# Patient Record
Sex: Female | Born: 1985 | Race: White | Hispanic: No | Marital: Married | Smoking: Former smoker
Health system: Southern US, Community
[De-identification: ages and names within clinical notes are randomized; demographics above are authoritative.]

## PROBLEM LIST (undated history)

## (undated) DIAGNOSIS — Z789 Other specified health status: Secondary | ICD-10-CM

## (undated) DIAGNOSIS — D649 Anemia, unspecified: Secondary | ICD-10-CM

## (undated) DIAGNOSIS — R519 Headache, unspecified: Secondary | ICD-10-CM

## (undated) DIAGNOSIS — R32 Unspecified urinary incontinence: Secondary | ICD-10-CM

## (undated) HISTORY — PX: WISDOM TOOTH EXTRACTION: SHX21

---
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
## 2005-02-26 ENCOUNTER — Other Ambulatory Visit: Admission: RE | Admit: 2005-02-26 | Discharge: 2005-02-26 | Payer: Self-pay | Admitting: Family Medicine

## 2007-04-08 ENCOUNTER — Other Ambulatory Visit: Admission: RE | Admit: 2007-04-08 | Discharge: 2007-04-08 | Payer: Self-pay | Admitting: Family Medicine

## 2008-04-17 ENCOUNTER — Other Ambulatory Visit: Admission: RE | Admit: 2008-04-17 | Discharge: 2008-04-17 | Payer: Self-pay | Admitting: Family Medicine

## 2009-05-22 ENCOUNTER — Other Ambulatory Visit: Admission: RE | Admit: 2009-05-22 | Discharge: 2009-05-22 | Payer: Self-pay | Admitting: Family Medicine

## 2010-01-23 ENCOUNTER — Encounter: Payer: Self-pay | Admitting: Orthopedic Surgery

## 2010-01-30 ENCOUNTER — Ambulatory Visit: Payer: Self-pay | Admitting: Orthopedic Surgery

## 2010-01-30 DIAGNOSIS — M542 Cervicalgia: Secondary | ICD-10-CM | POA: Insufficient documentation

## 2010-02-04 ENCOUNTER — Telehealth: Payer: Self-pay | Admitting: Orthopedic Surgery

## 2010-04-25 ENCOUNTER — Encounter: Admission: RE | Admit: 2010-04-25 | Discharge: 2010-06-13 | Payer: Self-pay | Admitting: Orthopedic Surgery

## 2010-04-29 ENCOUNTER — Encounter: Payer: Self-pay | Admitting: Orthopedic Surgery

## 2010-05-02 ENCOUNTER — Telehealth: Payer: Self-pay | Admitting: Orthopedic Surgery

## 2010-10-15 NOTE — Letter (Signed)
Summary: History form  History form   Imported By: Jacklynn Ganong 02/14/2010 13:33:40  _____________________________________________________________________  External Attachment:    Type:   Image     Comment:   External Document

## 2010-10-15 NOTE — Progress Notes (Signed)
Summary: meds not helping  Phone Note Call from Patient   Caller: Patient Summary of Call: She is taken Naproxen, robaxin, and prednisone and she has no relief. She is taking 6 Naproxen a day and a 8 Robxin a day with no relief. She wants to know if she needs to be seen again or you could call her some different medicine. [phone].  Follow-up for Phone Call        do not take 6 a day take 2 a day   if you take 6 you will get an ulcer  take the meds as presrcibed   add ultracet 1 q 4 prn pain # 42 refill 5 Follow-up by: Fuller Canada MD,  Feb 04, 2010 8:09 AM    New/Updated Medications: ULTRACET 37.5-325 MG TABS (TRAMADOL-ACETAMINOPHEN) one by mouth q 4 hrs as needed pain Prescriptions: ULTRACET 37.5-325 MG TABS (TRAMADOL-ACETAMINOPHEN) one by mouth q 4 hrs as needed pain  #42 x 1   Entered by:   Waldon Reining   Authorized by:   Fuller Canada MD   Signed by:   Waldon Reining on 02/04/2010   Method used:   Faxed to ...       [facility] [phone]* (retail)       [address]       [place], [place]  [address]       Ph: [phone] or [phone]       Fax: [phone]   RxID:   [id number]   Appended Document: meds not helping advised pt

## 2010-10-15 NOTE — Progress Notes (Signed)
Summary: wants stronger pain med  Phone Note Call from Patient   Summary of Call: Brandi Day ([birth year]) says that the Ultracet is not touching her pain now that she is in therapy.  Her neck hurts really badly. Can you prescribe something stronger?  [facility] [place] Her # [phone] Initial call taken by: Jacklynn Ganong,  May 02, 2010 10:10 AM  Follow-up for Phone Call        can not change pain medication and   PS I dont need to see her any sooner  Follow-up by: Fuller Canada MD,  May 02, 2010 10:12 AM  Additional Follow-up for Phone Call Additional follow up Details #1::        Advised patient of doctor's reply Additional Follow-up by: Jacklynn Ganong,  May 02, 2010 10:46 AM

## 2010-10-15 NOTE — Miscellaneous (Signed)
Summary: PT initial evaluation  PT initial evaluation   Imported By: Jacklynn Ganong 04/29/2010 11:06:55  _____________________________________________________________________  External Attachment:    Type:   Image     Comment:   External Document

## 2010-10-15 NOTE — Assessment & Plan Note (Signed)
Summary: neck pain needs xr/bcbs/bsf   Vital Signs:  Patient profile:   25 year old female Height:      64 inches Weight:      127 pounds Pulse rate:   84 / minute Resp:     16 per minute  Vitals Entered By: Fuller Canada MD (Jan 30, 2010 9:00 AM)  Visit Type:  new patient Referring Provider:  self Primary Provider:  [facility]  CC:  neck pain.  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  The pain is dull it becomes a 9/10 at worse in the morning and at night.  It came on gradually but is now constant.  She gets some relief with 16 ibuprofen a day.  It seemed to get better if she pops her neck.  She complains of locking and catching.  She does not get relief of Flexeril.  She does indicate that she's been under a lot of stress because she is in college, she is newly married, and she has a new home.  Meds: Xanax, Claratin, Omeprazole, Seasonale.    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 Psychiatric:  Complains of anxiety; denies nervousness, depression, and hallucinations. Immunology:  Complains of seasonal allergies; denies sinus problems and allergic to bee stings.  The review of systems is negative for Constitutional, Cardiovascular, Respiratory, Genitourinary, Neurologic,  Musculoskeletal, Endocrine, Skin, HEENT, and Hemoatologic.  Physical Exam  Additional Exam:  Constitutional: vital signs see recorded values. General: normal development, nutrition, and grooming. No deformity. Body Habitus is medium. CDV: Observation and palpation was normal  Lymph: palpation of the lymph nodes were normal Skin: inspection and palpation of the skin revealed no abnormalities  Neuro: coordination: normal              DTR's normal              Sensation was normal  Psyche: Alert and oriented x 3. Mood was normal.  Affect: normal  MSK: Gait: normal    Cervical spine exam shows that she has tenderness in the midline of the cervical spine upper thoracic spine and medial border of the scapula.  She has tenderness in both paraspinal muscles and both trapezius muscles.  This decreased and painful range of motion in all planes.  Spurling sign negative.  Upper extremities C5-T1 strength was normal.     Impression & Recommendations:  Problem # 1:  NECK PAIN (ICD-723.1) Assessment New  AP lateral cervical spine there is complete straightening and loss of the normal cervical lordosis.  There are no evidence of a space narrowing or spur formation.  Impression loss of contour cervical spine  Axial neck pain is the most likely diagnosis.  Stress of course is contributing.  Recommend physical therapy, patient education with an Academy handout, steroid Dosepak, Robaxin, and steroid Dosepak finished the Naprosyn.  Her updated medication list for  this problem includes:    Robaxin 500 Mg Tabs (Methocarbamol) .Marland Kitchen... 1 by mouth q 6    Naprosyn 500 Mg Tabs (Naproxen) .Marland Kitchen... 1 by mouth two times a day  Orders: Physical Therapy Referral (PT) New Patient Level III (83151)  Medications Added to Medication List This Visit: 1)  Prednisone (pak) 5 Mg Tabs (Prednisone) .... As directed 2)  Robaxin 500 Mg Tabs (Methocarbamol) .Marland Kitchen.. 1 by mouth q 6 3)  Naprosyn 500 Mg Tabs (Naproxen) .Marland Kitchen.. 1 by  mouth two times a day  Patient Instructions: 1)  Axial Neck Pain  2)  Limit activity to comfort and avoid activities that increase discomfort.  Apply moist heat and/or ice to neck and take medication as instructed for pain relief. Please read the Neck Pain Handout and start Physical Therapy as directed. 3)  Change to robaxin  4)  and dose pack x 12 days , then naprosyn 500 mg two times a day  5)  Please schedule a follow-up appointment as needed. Prescriptions: NAPROSYN 500 MG TABS (NAPROXEN) 1 by mouth two times a day  #60 x 5   Entered and Authorized by:   Fuller Canada MD   Signed by:   Fuller Canada MD on 01/30/2010   Method used:   Print then Give to Patient   RxID:   [id number] ROBAXIN 500 MG TABS (METHOCARBAMOL) 1 by mouth q 6  #60 x 2   Entered and Authorized by:   Fuller Canada MD   Signed by:   Fuller Canada MD on 01/30/2010   Method used:   Print then Give to Patient   RxID:   [id number] PREDNISONE (PAK) 5 MG TABS (PREDNISONE) as directed  #1 x 1   Entered and Authorized by:   Fuller Canada MD   Signed by:   Fuller Canada MD on 01/30/2010   Method used:   Print then Give to Patient   RxID:   [id number]

## 2014-03-28 ENCOUNTER — Ambulatory Visit (INDEPENDENT_AMBULATORY_CARE_PROVIDER_SITE_OTHER): Payer: BC Managed Care – PPO | Admitting: Obstetrics

## 2014-03-28 ENCOUNTER — Encounter: Payer: Self-pay | Admitting: Obstetrics

## 2014-03-28 VITALS — BP 131/86 | HR 70 | Temp 100.4°F | Ht 63.0 in | Wt 137.0 lb

## 2014-03-28 DIAGNOSIS — Z3202 Encounter for pregnancy test, result negative: Secondary | ICD-10-CM

## 2014-03-28 DIAGNOSIS — Z01419 Encounter for gynecological examination (general) (routine) without abnormal findings: Secondary | ICD-10-CM

## 2014-03-28 DIAGNOSIS — Z3009 Encounter for other general counseling and advice on contraception: Secondary | ICD-10-CM

## 2014-03-28 LAB — POCT URINE PREGNANCY: Preg Test, Ur: NEGATIVE

## 2014-03-28 MED ORDER — DESOGESTREL-ETHINYL ESTRADIOL 0.15-30 MG-MCG PO TABS: 1.0000 | ORAL_TABLET | Freq: Every day | ORAL | Status: DC

## 2014-03-28 NOTE — Progress Notes (Signed)
Subjective:     Brandi SimmersSamantha S Day is a 28 y.o. female here for a routine exam.  Current complaints:  None.    Personal health questionnaire:  Is patient Ashkenazi Jewish, have a family history of breast and/or ovarian cancer: no Is there a family history of uterine cancer diagnosed at age < 3950, gastrointestinal cancer, urinary tract cancer, family member who is a Personnel officerLynch syndrome-associated carrier: no Is the patient overweight and hypertensive, family history of diabetes, personal history of gestational diabetes or PCOS: no Is patient over 555, have PCOS,  family history of premature CHD under age 30, diabetes, smoke, have hypertension or peripheral artery disease:  no  The HPI was reviewed and explored in further detail by the provider. Gynecologic History Patient's last menstrual period was 02/26/2014. Contraception: OCP (estrogen/progesterone) Last Pap: 2014. Results were: normal Last mammogram: n/a. Results were: n/a  Obstetric History OB History  Gravida Para Term Preterm AB SAB TAB Ectopic Multiple Living  1 1 1       1     # Outcome Date GA Lbr Len/2nd Weight Sex Delivery Anes PTL Lv  1 TRM 04/05/12 [id number]  7 lb 8 oz (3.402 kg) F LTCS Spinal  Y      History reviewed. No pertinent past medical history.  Past Surgical History  Procedure Laterality Date  . Cesarean section    . Wisdom tooth extraction      Current outpatient prescriptions:desogestrel-ethinyl estradiol (APRI,EMOQUETTE,SOLIA) 0.15-30 MG-MCG tablet, Take 1 tablet by mouth daily., Disp: 1 Package, Rfl: 11 No Known Allergies  History  Substance Use Topics  . Smoking status: Former Smoker    Start date: 03/29/2011  . Smokeless tobacco: Never Used  . Alcohol Use: 1.2 oz/week    1 Glasses of wine, 1 Cans of beer per week    Family History  Problem Relation Age of Onset  . Cancer Mother   . Hypertension Father   . Diabetes Father       Review of Systems  Constitutional: negative for fatigue and weight  loss Respiratory: negative for cough and wheezing Cardiovascular: negative for chest pain, fatigue and palpitations Gastrointestinal: negative for abdominal pain and change in bowel habits Musculoskeletal:negative for myalgias Neurological: negative for gait problems and tremors Behavioral/Psych: negative for abusive relationship, depression Endocrine: negative for temperature intolerance   Genitourinary:negative for abnormal menstrual periods, genital lesions, hot flashes, sexual problems and vaginal discharge Integument/breast: negative for breast lump, breast tenderness, nipple discharge and skin lesion(s)    Objective:       BP 131/86  Pulse 70  Temp(Src) 100.4 F (38 C)  Ht 5\' 3"  (1.6 m)  Wt 137 lb (62.143 kg)  BMI 24.27 kg/m2  LMP 02/26/2014 General:   alert PE:        Breasts:  Negative      Abdomen:  Soft, nontender.      Pelvic:  NEFG.  Vulva, vagina and cervix normal.                   Uterus NSSC, nontender.                    Adnexa negative     Lab Review Urine pregnancy test negative Labs reviewed yes Radiologic studies reviewed yes    Assessment:    Healthy female exam.    Plan:    Education reviewed: self breast exams. Contraception: OCP (estrogen/progesterone). Follow up in: 1 year.   Meds ordered this encounter  Medications  . DISCONTD: desogestrel-ethinyl estradiol (APRI,EMOQUETTE,SOLIA) 0.15-30 MG-MCG tablet    Sig: Take 1 tablet by mouth daily.  Marland Kitchen desogestrel-ethinyl estradiol (APRI,EMOQUETTE,SOLIA) 0.15-30 MG-MCG tablet    Sig: Take 1 tablet by mouth daily.    Dispense:  1 Package    Refill:  11   No orders of the defined types were placed in this encounter.

## 2014-03-28 NOTE — Addendum Note (Signed)
Addended by: Odessa FlemingBOHNE, Otto Caraway M on: 03/28/2014 04:46 PM   Modules accepted: Orders

## 2014-03-29 LAB — WET PREP BY MOLECULAR PROBE
Candida species: NEGATIVE
Gardnerella vaginalis: NEGATIVE
Trichomonas vaginosis: NEGATIVE

## 2014-03-29 LAB — GC/CHLAMYDIA PROBE AMP
CT PROBE, AMP APTIMA: NEGATIVE
GC PROBE AMP APTIMA: NEGATIVE

## 2014-03-29 NOTE — Addendum Note (Signed)
Addended by: Odessa FlemingBOHNE, Jayvon Mounger M on: 03/29/2014 06:34 PM   Modules accepted: Orders

## 2014-03-30 LAB — PAP IG AND HPV HIGH-RISK: HPV DNA High Risk: NOT DETECTED

## 2014-07-17 ENCOUNTER — Encounter: Payer: Self-pay | Admitting: Obstetrics

## 2014-09-12 ENCOUNTER — Telehealth: Payer: Self-pay | Admitting: *Deleted

## 2014-09-12 NOTE — Telephone Encounter (Signed)
Error

## 2014-10-31 ENCOUNTER — Telehealth: Payer: Self-pay | Admitting: Obstetrics

## 2014-11-06 ENCOUNTER — Ambulatory Visit (INDEPENDENT_AMBULATORY_CARE_PROVIDER_SITE_OTHER): Payer: BLUE CROSS/BLUE SHIELD | Admitting: Obstetrics

## 2014-11-06 VITALS — BP 132/91 | HR 79 | Wt 143.0 lb

## 2014-11-06 DIAGNOSIS — N839 Noninflammatory disorder of ovary, fallopian tube and broad ligament, unspecified: Secondary | ICD-10-CM

## 2014-11-06 NOTE — Telephone Encounter (Signed)
11/06/2014 - Patient has not returned calls to schedule appt. brm

## 2014-11-07 ENCOUNTER — Encounter: Payer: Self-pay | Admitting: Obstetrics

## 2014-11-07 NOTE — Progress Notes (Signed)
Patient ID: Brandi Day, female   DOB: 08/25/1986, 29 y.o.   MRN: [id number]  Chief Complaint  Patient presents with  . Advice Only    HPI Brandi SimmersSamantha S Cassar is a 29 y.o. female.  Has been trying to get pregnant for the past 7 months without success.  Purchased ovulation indicators which are positive for ovulation.  Questions whether her fiance' is fertile.  HPI  History reviewed. No pertinent past medical history.  Past Surgical History  Procedure Laterality Date  . Cesarean section    . Wisdom tooth extraction      Family History  Problem Relation Age of Onset  . Cancer Mother   . Hypertension Father   . Diabetes Father     Social History History  Substance Use Topics  . Smoking status: Former Smoker    Start date: 03/29/2011  . Smokeless tobacco: Never Used  . Alcohol Use: 1.2 oz/week    1 Glasses of wine, 1 Cans of beer per week    No Known Allergies  Current Outpatient Prescriptions  Medication Sig Dispense Refill  . prenatal vitamin w/FE, FA (PRENATAL 1 + 1) 27-1 MG TABS tablet Take 1 tablet by mouth daily at 12 noon.    . desogestrel-ethinyl estradiol (APRI,EMOQUETTE,SOLIA) 0.15-30 MG-MCG tablet Take 1 tablet by mouth daily. (Patient not taking: Reported on 11/06/2014) 1 Package 11   No current facility-administered medications for this visit.    Review of Systems Review of Systems Constitutional: negative for fatigue and weight loss Respiratory: negative for cough and wheezing Cardiovascular: negative for chest pain, fatigue and palpitations Gastrointestinal: negative for abdominal pain and change in bowel habits Genitourinary:negative Integument/breast: negative for nipple discharge Musculoskeletal:negative for myalgias Neurological: negative for gait problems and tremors Behavioral/Psych: negative for abusive relationship, depression Endocrine: negative for temperature intolerance     Blood pressure 132/91, pulse 79, weight 143 lb (64.864 kg), last  menstrual period 10/16/2014.  Physical Exam Physical Exam General:   alert  Skin:   no rash or abnormalities  Lungs:   clear to auscultation bilaterally  Heart:   regular rate and rhythm, S1, S2 normal, no murmur, click, rub or gallop  Breasts:   normal without suspicious masses, skin or nipple changes or axillary nodes  Abdomen:  normal findings: no organomegaly, soft, non-tender and no hernia  Pelvis:  External genitalia: normal general appearance Urinary system: urethral meatus normal and bladder without fullness, nontender Vaginal: normal without tenderness, induration or masses Cervix: normal appearance Adnexa: normal bimanual exam Uterus: anteverted and non-tender, normal size    100% of 10 min visit spent on counseling and coordination of care.   Data Reviewed Menstrual History  Assessment     Possible ovulation disorder      Plan    Start timed intercourse on days 10 - 20 only, every other day. F/U in 4 months. May need referral to Rep. Endo.  No orders of the defined types were placed in this encounter.   Meds ordered this encounter  Medications  . prenatal vitamin w/FE, FA (PRENATAL 1 + 1) 27-1 MG TABS tablet    Sig: Take 1 tablet by mouth daily at 12 noon.

## 2014-12-18 ENCOUNTER — Encounter (HOSPITAL_COMMUNITY): Payer: Self-pay | Admitting: *Deleted

## 2014-12-18 ENCOUNTER — Inpatient Hospital Stay (HOSPITAL_COMMUNITY)
Admission: AD | Admit: 2014-12-18 | Discharge: 2014-12-18 | Disposition: A | Discharge status: Home / Self Care | Payer: BLUE CROSS/BLUE SHIELD | Source: Ambulatory Visit | Attending: Obstetrics | Admitting: Obstetrics

## 2014-12-18 DIAGNOSIS — Z3202 Encounter for pregnancy test, result negative: Secondary | ICD-10-CM | POA: Insufficient documentation

## 2014-12-18 DIAGNOSIS — Z87891 Personal history of nicotine dependence: Secondary | ICD-10-CM | POA: Insufficient documentation

## 2014-12-18 DIAGNOSIS — N939 Abnormal uterine and vaginal bleeding, unspecified: Secondary | ICD-10-CM | POA: Insufficient documentation

## 2014-12-18 LAB — HCG, QUANTITATIVE, PREGNANCY: hCG, Beta Chain, Quant, S: 2 m[IU]/mL (ref <5)

## 2014-12-18 LAB — URINALYSIS, ROUTINE W REFLEX MICROSCOPIC
Bilirubin Urine: NEGATIVE
Glucose, UA: NEGATIVE mg/dL
Ketones, ur: NEGATIVE mg/dL
Leukocytes, UA: NEGATIVE
Nitrite: NEGATIVE
Protein, ur: NEGATIVE mg/dL
Specific Gravity, Urine: 1.015 (ref 1.005–1.030)
Urobilinogen, UA: 0.2 mg/dL (ref 0.0–1.0)
pH: 7.5 (ref 5.0–8.0)

## 2014-12-18 LAB — URINE MICROSCOPIC-ADD ON

## 2014-12-18 NOTE — MAU Provider Note (Signed)
History     CSN: [id number]  Arrival date and time: 12/18/14 1026   First Provider Initiated Contact with Patient 12/18/14 1201      Chief Complaint  Patient presents with  . Miscarriage   HPI  Ms. Brandi Day is a 29 y.o. G1P1001 at Unknown who presents to MAU today with complaint of vaginal bleeding. The patient states that she was trying to conceive and had a +HPT on Thursday. She then went to Urgent Care to confirm the pregnancy. The UPT there was negative and quant hCG was 10 per patient report. The patient began having bleeding a little heavier than a period on Saturday night. She was having cramping, but denies pain now. She also denies fever or N/V/D. Last intercourse was Saturday night prior to the onset of bleeding. LMP 11/13/14.   OB History    Gravida Para Term Preterm AB TAB SAB Ectopic Multiple Living   1 1 1       1       History reviewed. No pertinent past medical history.  Past Surgical History  Procedure Laterality Date  . Cesarean section    . Wisdom tooth extraction      Family History  Problem Relation Age of Onset  . Cancer Mother   . Hypertension Father   . Diabetes Father     History  Substance Use Topics  . Smoking status: Former Smoker    Start date: 03/29/2011  . Smokeless tobacco: Never Used  . Alcohol Use: 1.2 oz/week    1 Glasses of wine, 1 Cans of beer per week     Comment: LAST  DRANK-  WINE  - YESTERDAY    Allergies: No Known Allergies  No prescriptions prior to admission    Review of Systems  Constitutional: Negative for fever and malaise/fatigue.  Gastrointestinal: Negative for nausea, vomiting, abdominal pain, diarrhea and constipation.  Genitourinary:       + vaginal bleeding   Physical Exam   Blood pressure 110/69, pulse 70, temperature 98.2 F (36.8 C), temperature source Oral, resp. rate 20, height 5\' 2"  (1.575 m), weight 145 lb (65.772 kg), last menstrual period 11/13/2014.  Physical Exam  Constitutional: She  is oriented to person, place, and time. She appears well-developed and well-nourished. No distress.  HENT:  Head: Normocephalic.  Cardiovascular: Normal rate.   Respiratory: Effort normal.  GI: Soft. She exhibits no distension and no mass. There is tenderness (very mild tenderness to palpation of the suprapubic region at midline). There is no rebound and no guarding.  Genitourinary: Uterus is not enlarged and not tender. Cervix exhibits no motion tenderness, no discharge and no friability. Right adnexum displays no mass and no tenderness. Left adnexum displays no mass and no tenderness. There is bleeding (small amount of blood noted in the vaginal vault, mostly dark red/brown) in the vagina. No vaginal discharge found.  Cervix: closed, thick, posterior  Neurological: She is alert and oriented to person, place, and time.  Skin: Skin is warm and dry. No erythema.  Psychiatric: She has a normal mood and affect.   Results for orders placed or performed during the hospital encounter of 12/18/14 (from the past 24 hour(s))  Urinalysis, Routine w reflex microscopic     Status: Abnormal   Collection Time: 12/18/14 10:43 AM  Result Value Ref Range   Color, Urine YELLOW YELLOW   APPearance CLEAR CLEAR   Specific Gravity, Urine 1.015 1.005 - 1.030   pH 7.5 5.0 - 8.0  Glucose, UA NEGATIVE NEGATIVE mg/dL   Hgb urine dipstick TRACE (A) NEGATIVE   Bilirubin Urine NEGATIVE NEGATIVE   Ketones, ur NEGATIVE NEGATIVE mg/dL   Protein, ur NEGATIVE NEGATIVE mg/dL   Urobilinogen, UA 0.2 0.0 - 1.0 mg/dL   Nitrite NEGATIVE NEGATIVE   Leukocytes, UA NEGATIVE NEGATIVE  Urine microscopic-add on     Status: Abnormal   Collection Time: 12/18/14 10:43 AM  Result Value Ref Range   Squamous Epithelial / LPF FEW (A) RARE   WBC, UA 0-2 <3 WBC/hpf   Bacteria, UA RARE RARE  hCG, quantitative, pregnancy     Status: None   Collection Time: 12/18/14 11:43 AM  Result Value Ref Range   hCG, Beta Chain, Quant, S 2 <5  mIU/mL    MAU Course  Procedures None  MDM UPT - negative, given patient report of recent +HPT, quant hCG ordered.  Quant hCG is <5 Without other labs in our system unsure if this is early SAB vs recent false positive HPT Discussed with patient that given her report of recent events it is more likely a very early SAB that is now complete  Assessment and Plan  A: Vaginal bleeding  P: Discharge home Bleeding precautions discussed Patient advised to follow-up with Dr. Clearance Coots as scheduled Patient may return to MAU as needed or if her condition were to change or worsen    Marny Lowenstein, PA-C  12/18/2014, 2:00 PM

## 2014-12-18 NOTE — Discharge Instructions (Signed)
Miscarriage °A miscarriage is the loss of an unborn baby (fetus) before the 20th week of pregnancy. The cause is often unknown.  °HOME CARE °· You may need to stay in bed (bed rest), or you may be able to do light activity. Go about activity as told by your doctor. °· Have help at home. °· Write down how many pads you use each day. Write down how soaked they are. °· Do not use tampons. Do not wash out your vagina (douche) or have sex (intercourse) until your doctor approves. °· Only take medicine as told by your doctor. °· Do not take aspirin. °· Keep all doctor visits as told. °· If you or your partner have problems with grieving, talk to your doctor. You can also try counseling. Give yourself time to grieve before trying to get pregnant again. °GET HELP RIGHT AWAY IF: °· You have bad cramps or pain in your back or belly (abdomen). °· You have a fever. °· You pass large clumps of blood (clots) from your vagina that are walnut-sized or larger. Save the clumps for your doctor to see. °· You pass large amounts of tissue from your vagina. Save the tissue for your doctor to see. °· You have more bleeding. °· You have thick, bad-smelling fluid (discharge) coming from the vagina. °· You get lightheaded, weak, or you pass out (faint). °· You have chills. °MAKE SURE YOU: °· Understand these instructions. °· Will watch your condition. °· Will get help right away if you are not doing well or get worse. °Document Released: 11/24/2011 Document Reviewed: 11/24/2011 °ExitCare® Patient Information ©2015 [employer]. This information is not intended to replace advice given to you by your health care provider. Make sure you discuss any questions you have with your health care provider. ° °

## 2014-12-18 NOTE — MAU Note (Addendum)
PT SAYS SHE WAS IN DR HARPER'S  OFFICE IN JAN    -   TOLD UNABLE  TO GET PREG.-   SO PLAN WAS TO TRY  TO GET PREG  AND IF  NOT BY MAY - RETURN TO OFFICE,     LAST  Thursday-   HAD POSITIVE HPT-  THEN WENT  TO URGENT  CARE  FOR LABS -     THEIR UPT  WAS NEG   BUT  SERUM TEST  WAS  POSITIVE-  QUANT     THEN ON SAT NIGHT  STARTED  CRAMPING - WENT  TO B-ROOM - SAW VAG BLEEDING  AND HAS CONTINUED  LIKE   CYCLE -   ON PAD NOW IN ROOM - NOTHING.     NO PAIN  NOW.

## 2015-01-31 ENCOUNTER — Telehealth: Payer: Self-pay | Admitting: *Deleted

## 2015-01-31 NOTE — Telephone Encounter (Signed)
Patient states she was in to see Dr Clearance CootsHarper and she discussed a referral to infertility- she tried to call for an appointment and was told she does need a referral. Call forwarded to provider for referral order.

## 2015-02-05 ENCOUNTER — Ambulatory Visit: Payer: BLUE CROSS/BLUE SHIELD | Admitting: Obstetrics

## 2015-02-06 ENCOUNTER — Other Ambulatory Visit: Payer: Self-pay | Admitting: Obstetrics

## 2015-02-06 DIAGNOSIS — N839 Noninflammatory disorder of ovary, fallopian tube and broad ligament, unspecified: Secondary | ICD-10-CM

## 2015-02-14 ENCOUNTER — Telehealth: Payer: Self-pay

## 2015-02-14 NOTE — Telephone Encounter (Signed)
FYI, Dr. Lyndal Rainbowamer Yalcinkaya's office is backed up [facility]) for scheduling referrals due to fact they are short-staffed. Zella BallRobin, who schedules referrals should be caught up next week or so and will schedule appts and call patients. I let patient(s) know.

## 2015-02-14 NOTE — Telephone Encounter (Signed)
Patient is scheduled with Dr. April MansonYalcinkaya on 04/13/15 at 10:30am [facility] [place] office was first available - patient is aware of appt and time

## 2015-02-15 ENCOUNTER — Other Ambulatory Visit (INDEPENDENT_AMBULATORY_CARE_PROVIDER_SITE_OTHER): Payer: BLUE CROSS/BLUE SHIELD

## 2015-02-15 VITALS — BP 123/82 | HR 75 | Temp 100.1°F | Ht 63.0 in | Wt 144.0 lb

## 2015-02-15 DIAGNOSIS — N926 Irregular menstruation, unspecified: Secondary | ICD-10-CM | POA: Diagnosis not present

## 2015-02-15 LAB — POCT URINE PREGNANCY: PREG TEST UR: POSITIVE

## 2015-02-15 NOTE — Progress Notes (Unsigned)
Patient in office today for a confirmation of pregnancy. Patient states she had 3 positive home pregnancy test. Patient is concerned because she had a chemical pregnancy about 2 months ago. Pregnancy Test in office is positive.   BP 123/82 mmHg  Pulse 75  Temp(Src) 100.1 F (37.8 C)  Ht 5\' 3"  (1.6 m)  Wt 144 lb (65.318 kg)  BMI 25.51 kg/m2  LMP 01/14/2015

## 2015-02-16 ENCOUNTER — Telehealth: Payer: Self-pay | Admitting: *Deleted

## 2015-02-16 LAB — HCG, QUANTITATIVE, PREGNANCY: HCG, BETA CHAIN, QUANT, S: 38.5 m[IU]/mL

## 2015-02-16 NOTE — Telephone Encounter (Signed)
Patient called requesting her results from her Hcg blood work. Patient advised of test results. Patient advised to come Monday morning for a repeat Hcg. Patient verbalized understanding.

## 2015-02-19 ENCOUNTER — Ambulatory Visit: Payer: BLUE CROSS/BLUE SHIELD

## 2015-02-19 DIAGNOSIS — N926 Irregular menstruation, unspecified: Secondary | ICD-10-CM

## 2015-02-20 LAB — HCG, QUANTITATIVE, PREGNANCY: hCG, Beta Chain, Quant, S: 266.4 m[IU]/mL

## 2015-02-21 ENCOUNTER — Other Ambulatory Visit: Payer: BLUE CROSS/BLUE SHIELD

## 2015-02-21 DIAGNOSIS — N926 Irregular menstruation, unspecified: Secondary | ICD-10-CM

## 2015-02-22 LAB — HCG, QUANTITATIVE, PREGNANCY: hCG, Beta Chain, Quant, S: 562 m[IU]/mL

## 2015-02-26 ENCOUNTER — Other Ambulatory Visit: Payer: Self-pay | Admitting: Certified Nurse Midwife

## 2015-02-26 ENCOUNTER — Telehealth: Payer: Self-pay | Admitting: *Deleted

## 2015-02-26 ENCOUNTER — Ambulatory Visit (HOSPITAL_COMMUNITY)
Admission: RE | Admit: 2015-02-26 | Discharge: 2015-02-26 | Disposition: A | Discharge status: Home / Self Care | Payer: BLUE CROSS/BLUE SHIELD | Source: Ambulatory Visit | Attending: Certified Nurse Midwife | Admitting: Certified Nurse Midwife

## 2015-02-26 ENCOUNTER — Other Ambulatory Visit: Payer: Self-pay | Admitting: *Deleted

## 2015-02-26 DIAGNOSIS — O3680X0 Pregnancy with inconclusive fetal viability, not applicable or unspecified: Secondary | ICD-10-CM | POA: Diagnosis present

## 2015-02-26 DIAGNOSIS — O3680X1 Pregnancy with inconclusive fetal viability, fetus 1: Secondary | ICD-10-CM

## 2015-02-26 NOTE — Telephone Encounter (Signed)
Patient just following up to see when her ultrasound would be scheduled for. Patient advised the order is in and Lesle Reek is working on scheduling her appointment. Patient advised she should receive a call for her appointment. Patient also advised once she has her ultrasound and we determine viability we will schedule her NOB appointment. Patient verbalized understanding.

## 2015-02-26 NOTE — Telephone Encounter (Signed)
Patient contacted the office upset after her ultrasound today. Patient is requesting ultrasound results. Reviewed ultrasound results with patient. Patient reassured that everything is progressing as it should. Patient advised she will have a follow up ultrasound in 2 weeks to make sure baby is seen. Patient verbalized understanding.

## 2015-02-28 ENCOUNTER — Other Ambulatory Visit: Payer: Self-pay | Admitting: Certified Nurse Midwife

## 2015-02-28 ENCOUNTER — Telehealth: Payer: Self-pay

## 2015-02-28 DIAGNOSIS — Z3490 Encounter for supervision of normal pregnancy, unspecified, unspecified trimester: Secondary | ICD-10-CM

## 2015-02-28 NOTE — Telephone Encounter (Signed)
PATIENT WANTED TO CANCEL U/S AT [facility] ON 6/27, AS SHE IS GOING TO [facility] AND HAS REQUESTED HER MEDICAL RECORDS BE SENT THERE

## 2015-03-08 ENCOUNTER — Other Ambulatory Visit: Payer: BLUE CROSS/BLUE SHIELD

## 2015-03-12 ENCOUNTER — Ambulatory Visit (HOSPITAL_COMMUNITY): Payer: BLUE CROSS/BLUE SHIELD

## 2015-03-21 LAB — OB RESULTS CONSOLE ABO/RH: RH Type: POSITIVE

## 2015-03-21 LAB — OB RESULTS CONSOLE ANTIBODY SCREEN: ANTIBODY SCREEN: NEGATIVE

## 2015-03-21 LAB — OB RESULTS CONSOLE RUBELLA ANTIBODY, IGM: RUBELLA: IMMUNE

## 2015-03-21 LAB — OB RESULTS CONSOLE HEPATITIS B SURFACE ANTIGEN: Hepatitis B Surface Ag: NEGATIVE

## 2015-03-21 LAB — OB RESULTS CONSOLE HIV ANTIBODY (ROUTINE TESTING): HIV: NONREACTIVE

## 2015-03-21 LAB — OB RESULTS CONSOLE RPR: RPR: NONREACTIVE

## 2015-03-28 LAB — OB RESULTS CONSOLE GC/CHLAMYDIA
CHLAMYDIA, DNA PROBE: NEGATIVE
GC PROBE AMP, GENITAL: NEGATIVE

## 2015-09-16 NOTE — L&D Delivery Note (Signed)
Delivery Note At 5:24 PM a viable and healthy female was delivered via  (Presentation: LOA  ).  APGAR: 6, 9; weight  pending.   Placenta status: spontaneious, intact.  Cord:  with the following complications: none.  Cord pH: pending  Anesthesia:  epidural Episiotomy:  none Lacerations:  none Suture Repair: 2.0 vicryl rapide Est. Blood Loss (mL):  200  Mom to postpartum.  Baby to Couplet care / Skin to Skin.  Yalda Herd J 10/15/2015, 5:35 PM

## 2015-09-25 LAB — OB RESULTS CONSOLE GBS: GBS: NEGATIVE

## 2015-10-14 ENCOUNTER — Inpatient Hospital Stay (HOSPITAL_COMMUNITY)
Admission: AD | Admit: 2015-10-14 | Discharge: 2015-10-17 | DRG: 775 | Disposition: A | Discharge status: Home / Self Care | Payer: BLUE CROSS/BLUE SHIELD | Source: Ambulatory Visit | Attending: Obstetrics and Gynecology | Admitting: Obstetrics and Gynecology

## 2015-10-14 ENCOUNTER — Inpatient Hospital Stay (HOSPITAL_COMMUNITY): Payer: BLUE CROSS/BLUE SHIELD | Admitting: Anesthesiology

## 2015-10-14 ENCOUNTER — Encounter (HOSPITAL_COMMUNITY): Payer: Self-pay | Admitting: *Deleted

## 2015-10-14 DIAGNOSIS — Z8249 Family history of ischemic heart disease and other diseases of the circulatory system: Secondary | ICD-10-CM | POA: Diagnosis not present

## 2015-10-14 DIAGNOSIS — O34219 Maternal care for unspecified type scar from previous cesarean delivery: Secondary | ICD-10-CM | POA: Diagnosis present

## 2015-10-14 DIAGNOSIS — D72829 Elevated white blood cell count, unspecified: Secondary | ICD-10-CM | POA: Diagnosis not present

## 2015-10-14 DIAGNOSIS — D62 Acute posthemorrhagic anemia: Secondary | ICD-10-CM | POA: Diagnosis not present

## 2015-10-14 DIAGNOSIS — O4292 Full-term premature rupture of membranes, unspecified as to length of time between rupture and onset of labor: Principal | ICD-10-CM | POA: Diagnosis present

## 2015-10-14 DIAGNOSIS — Z833 Family history of diabetes mellitus: Secondary | ICD-10-CM

## 2015-10-14 DIAGNOSIS — Z87891 Personal history of nicotine dependence: Secondary | ICD-10-CM

## 2015-10-14 DIAGNOSIS — O9081 Anemia of the puerperium: Secondary | ICD-10-CM | POA: Diagnosis not present

## 2015-10-14 DIAGNOSIS — D509 Iron deficiency anemia, unspecified: Secondary | ICD-10-CM | POA: Diagnosis present

## 2015-10-14 DIAGNOSIS — O4290 Premature rupture of membranes, unspecified as to length of time between rupture and onset of labor, unspecified weeks of gestation: Secondary | ICD-10-CM | POA: Diagnosis not present

## 2015-10-14 DIAGNOSIS — Z3A37 37 weeks gestation of pregnancy: Secondary | ICD-10-CM | POA: Diagnosis not present

## 2015-10-14 DIAGNOSIS — O99019 Anemia complicating pregnancy, unspecified trimester: Secondary | ICD-10-CM

## 2015-10-14 HISTORY — DX: Other specified health status: Z78.9

## 2015-10-14 LAB — TYPE AND SCREEN
ABO/RH(D): AB POS
ANTIBODY SCREEN: NEGATIVE

## 2015-10-14 LAB — CBC
HEMATOCRIT: 31.7 % — AB (ref 36.0–46.0)
HEMOGLOBIN: 10.6 g/dL — AB (ref 12.0–15.0)
MCH: 28 pg (ref 26.0–34.0)
MCHC: 33.4 g/dL (ref 30.0–36.0)
MCV: 83.6 fL (ref 78.0–100.0)
Platelets: 302 10*3/uL (ref 150–400)
RBC: 3.79 MIL/uL — ABNORMAL LOW (ref 3.87–5.11)
RDW: 13.5 % (ref 11.5–15.5)
WBC: 10.5 10*3/uL (ref 4.0–10.5)

## 2015-10-14 LAB — ABO/RH: ABO/RH(D): AB POS

## 2015-10-14 LAB — POCT FERN TEST: POCT Fern Test: POSITIVE

## 2015-10-14 MED ORDER — OXYCODONE-ACETAMINOPHEN 5-325 MG PO TABS: 2.0000 | ORAL_TABLET | ORAL | Status: DC | PRN

## 2015-10-14 MED ORDER — LACTATED RINGERS IV SOLN
INTRAVENOUS | Status: DC
  Administered 2015-10-14 – 2015-10-15 (×2): via INTRAVENOUS

## 2015-10-14 MED ORDER — ONDANSETRON HCL 4 MG/2ML IJ SOLN: 4.0000 mg | Freq: Four times a day (QID) | INTRAMUSCULAR | Status: DC | PRN

## 2015-10-14 MED ORDER — EPHEDRINE 5 MG/ML INJ
10.0000 mg | INTRAVENOUS | Status: DC | PRN
  Filled 2015-10-14: qty 2

## 2015-10-14 MED ORDER — EPHEDRINE 5 MG/ML INJ: 10.0000 mg | INTRAVENOUS | Status: DC | PRN

## 2015-10-14 MED ORDER — OXYCODONE-ACETAMINOPHEN 5-325 MG PO TABS
1.0000 | ORAL_TABLET | ORAL | Status: DC | PRN
Start: 2015-10-14 — End: 2015-10-15

## 2015-10-14 MED ORDER — LACTATED RINGERS IV SOLN
500.0000 mL | INTRAVENOUS | Status: DC | PRN
  Administered 2015-10-14: 500 mL via INTRAVENOUS
  Administered 2015-10-15: 300 mL via INTRAVENOUS
  Administered 2015-10-15: 500 mL via INTRAVENOUS

## 2015-10-14 MED ORDER — FLEET ENEMA 7-19 GM/118ML RE ENEM: 1.0000 | ENEMA | RECTAL | Status: DC | PRN

## 2015-10-14 MED ORDER — DIPHENHYDRAMINE HCL 50 MG/ML IJ SOLN: 12.5000 mg | INTRAMUSCULAR | Status: DC | PRN

## 2015-10-14 MED ORDER — ACETAMINOPHEN 325 MG PO TABS
650.0000 mg | ORAL_TABLET | ORAL | Status: DC | PRN
  Administered 2015-10-15 (×2): 650 mg via ORAL
  Filled 2015-10-14 (×2): qty 2

## 2015-10-14 MED ORDER — OXYTOCIN BOLUS FROM INFUSION: 500.0000 mL | INTRAVENOUS | Status: DC

## 2015-10-14 MED ORDER — LIDOCAINE HCL (PF) 1 % IJ SOLN
INTRAMUSCULAR | Status: DC | PRN
  Administered 2015-10-14: 4 mL via EPIDURAL
  Administered 2015-10-14: 3 mL via EPIDURAL

## 2015-10-14 MED ORDER — LIDOCAINE HCL (PF) 1 % IJ SOLN
30.0000 mL | INTRAMUSCULAR | Status: DC | PRN
  Filled 2015-10-14: qty 30

## 2015-10-14 MED ORDER — FENTANYL 2.5 MCG/ML BUPIVACAINE 1/10 % EPIDURAL INFUSION (WH - ANES): 14.0000 mL/h | INTRAMUSCULAR | Status: DC | PRN

## 2015-10-14 MED ORDER — PHENYLEPHRINE 40 MCG/ML (10ML) SYRINGE FOR IV PUSH (FOR BLOOD PRESSURE SUPPORT): 80.0000 ug | PREFILLED_SYRINGE | INTRAVENOUS | Status: DC | PRN

## 2015-10-14 MED ORDER — PHENYLEPHRINE 40 MCG/ML (10ML) SYRINGE FOR IV PUSH (FOR BLOOD PRESSURE SUPPORT)
80.0000 ug | PREFILLED_SYRINGE | INTRAVENOUS | Status: DC | PRN
  Administered 2015-10-14 – 2015-10-15 (×2): 80 ug via INTRAVENOUS
  Filled 2015-10-14: qty 2
  Filled 2015-10-14 (×2): qty 20

## 2015-10-14 MED ORDER — FENTANYL 2.5 MCG/ML BUPIVACAINE 1/10 % EPIDURAL INFUSION (WH - ANES)
14.0000 mL/h | INTRAMUSCULAR | Status: DC | PRN
  Administered 2015-10-15 (×2): 14 mL/h via EPIDURAL
  Filled 2015-10-14 (×3): qty 125

## 2015-10-14 MED ORDER — OXYTOCIN 10 UNIT/ML IJ SOLN: 2.5000 [IU]/h | INTRAVENOUS | Status: DC

## 2015-10-14 MED ORDER — CITRIC ACID-SODIUM CITRATE 334-500 MG/5ML PO SOLN: 30.0000 mL | ORAL | Status: DC | PRN

## 2015-10-14 NOTE — Progress Notes (Signed)
Brandi Day is a 30 y.o. G2P1001 at [redacted]w[redacted]d by LMP admitted for rupture of membranes for attempted TOLAC with unfavorable cervix  Subjective: Now comfortable after epidural  Objective: BP 117/67 mmHg  Pulse 108  Temp(Src) 98 F (36.7 C) (Oral)  Resp 16  Ht  (1.6 m)  Wt 77.111 kg (170 lb)  BMI 30.12 kg/m2  SpO2 99%  LMP 01/14/2015      FHT:  FHR: 145 bpm, variability: moderate,  accelerations:  Present,  decelerations:  Absent UC:   irregular, every 2-5 minutes SVE:   Dilation: 1 Effacement (%): 70, 80 Station: -3 Exam by:: AMariel Sleet, RN  Clear AF  Labs: Lab Results  Component Value Date   WBC 10.5 10/14/2015   HGB 10.6* 10/14/2015   HCT 31.7* 10/14/2015   MCV 83.6 10/14/2015   PLT 302 10/14/2015    Assessment / Plan: Spontaneous labor, progressing normally  Latent phase post SROM- no s/s chorio Unfavorable cervix- wants TOLAC Previous csection for active phase arrest GBS negative  Labor: Progressing normally Preeclampsia:  no signs or symptoms of toxicity Fetal Wellbeing:  Category I Pain Control:  Epidural I/D:  n/a Anticipated MOD:  Guarded. Start Pitocin if no enters active labor by am  Yavuz Kirby J 10/14/2015, 9:06 PM

## 2015-10-14 NOTE — MAU Note (Addendum)
Contractions started around 1345 when she noticed a small gush of clear fluid.  Denies VB. Rare contractions. Good FM. Clear AF.

## 2015-10-14 NOTE — Anesthesia Procedure Notes (Signed)
Epidural Patient location during procedure: OB Start time: 10/14/2015 7:41 PM  Staffing Anesthesiologist: Berma Harts  Preanesthetic Checklist Completed: patient identified, site marked, surgical consent, pre-op evaluation, timeout performed, IV checked, risks and benefits discussed and monitors and equipment checked  Epidural Patient position: sitting Prep: DuraPrep Patient monitoring: heart rate, cardiac monitor, continuous pulse ox and blood pressure Approach: left paramedian Location: L3-L4 Injection technique: LOR saline  Needle:  Needle type: Tuohy  Needle gauge: 17 G Needle length: 9 cm Needle insertion depth: 6 cm Catheter type: closed end flexible Catheter size: 20 Guage Catheter at skin depth: 9 cm Test dose: negative

## 2015-10-14 NOTE — Anesthesia Preprocedure Evaluation (Signed)
Anesthesia Evaluation  Patient identified by MRN, date of birth, ID band Patient awake    Reviewed: Allergy & Precautions, NPO status , Patient's Chart, lab work & pertinent test results  Airway Mallampati: I  TM Distance: >3 FB Neck ROM: Full    Dental  (+) Teeth Intact, Dental Advisory Given   Pulmonary former smoker,    breath sounds clear to auscultation       Cardiovascular  Rhythm:Regular Rate:Normal     Neuro/Psych    GI/Hepatic   Endo/Other    Renal/GU      Musculoskeletal   Abdominal   Peds  Hematology   Anesthesia Other Findings   Reproductive/Obstetrics (+) Pregnancy                             Anesthesia Physical Anesthesia Plan  ASA: II  Anesthesia Plan: Epidural   Post-op Pain Management:    Induction:   Airway Management Planned: Natural Airway  Additional Equipment:   Intra-op Plan:   Post-operative Plan:   Informed Consent: I have reviewed the patients History and Physical, chart, labs and discussed the procedure including the risks, benefits and alternatives for the proposed anesthesia with the patient or authorized representative who has indicated his/her understanding and acceptance.   Dental advisory given  Plan Discussed with: Anesthesiologist  Anesthesia Plan Comments:         Anesthesia Quick Evaluation

## 2015-10-14 NOTE — H&P (Signed)
Brandi Day is a 30 y.o. female presenting for SROM at term. Wants TOLAC.  Maternal Medical History:  Reason for admission: Rupture of membranes.   Contractions: Onset was less than 1 hour ago.   Frequency: rare.   Perceived severity is mild.    Fetal activity: Perceived fetal activity is normal.   Last perceived fetal movement was within the past hour.    Prenatal complications: no prenatal complications Prenatal Complications - Diabetes: none.    OB History    Gravida Para Term Preterm AB TAB SAB Ectopic Multiple Living   History reviewed. No pertinent past medical history. Past Surgical History  Procedure Laterality Date  . Cesarean section    . Wisdom tooth extraction     Family History: family history includes Cancer in her mother; Diabetes in her father; Hypertension in her father. Social History:  reports that she has quit smoking. She started smoking about 4 years ago. She has never used smokeless tobacco. She reports that she drinks alcohol. She reports that she does not use illicit drugs.   Prenatal Transfer Tool  Maternal Diabetes: No Genetic Screening: Normal Maternal Ultrasounds/Referrals: Normal Fetal Ultrasounds or other Referrals:  None Maternal Substance Abuse:  No Significant Maternal Medications:  None Significant Maternal Lab Results:  None Other Comments:  None  Review of Systems  Constitutional: Negative.   All other systems reviewed and are negative.   Dilation: 1 Effacement (%): 70, 80 Station: -3 Exam by:: A. Gagliardo, RN Blood pressure 132/83, pulse 102, temperature 97.8 F (36.6 C), temperature source Oral, resp. rate 17, last menstrual period 01/14/2015. Maternal Exam:  Uterine Assessment: Contraction strength is mild.  Contraction frequency is rare.   Abdomen: Patient reports no abdominal tenderness. Surgical scars: low transverse.   Fetal presentation: vertex  Introitus: Normal vulva. Normal vagina.   Ferning test: positive.  Nitrazine test: positive. Amniotic fluid character: clear.  Pelvis: questionable for delivery.   Cervix: Cervix evaluated by digital exam.     Physical Exam  Constitutional: She is oriented to person, place, and time. She appears well-developed and well-nourished.  HENT:  Head: Normocephalic.  Neck: Normal range of motion. Neck supple.  Cardiovascular: Normal rate and regular rhythm.   Respiratory: Effort normal and breath sounds normal.  GI: Soft. Bowel sounds are normal.  Genitourinary: Vagina normal and uterus normal.  Musculoskeletal: Normal range of motion.  Neurological: She is alert and oriented to person, place, and time. She has normal reflexes.  Skin: Skin is warm and dry.  Psychiatric: She has a normal mood and affect.    Prenatal labs: ABO, Rh:   Antibody:   Rubella:   RPR:    HBsAg:    HIV:    GBS:     Assessment/Plan: SROM at term TOLAC Unfavorable cervix Poor prognosis for VD Expectant management Pt acknowledges risks vs benefts. Will proceed as noted.   Brandi Day J 10/14/2015, 5:53 PM

## 2015-10-15 ENCOUNTER — Encounter (HOSPITAL_COMMUNITY): Payer: Self-pay | Admitting: Obstetrics and Gynecology

## 2015-10-15 LAB — RPR: RPR: NONREACTIVE

## 2015-10-15 MED ORDER — WITCH HAZEL-GLYCERIN EX PADS
1.0000 | MEDICATED_PAD | CUTANEOUS | Status: DC | PRN
Start: 2015-10-15 — End: 2015-10-17
  Administered 2015-10-15: 1 via TOPICAL

## 2015-10-15 MED ORDER — IBUPROFEN 600 MG PO TABS
600.0000 mg | ORAL_TABLET | Freq: Four times a day (QID) | ORAL | Status: DC
  Administered 2015-10-15 – 2015-10-17 (×6): 600 mg via ORAL
  Filled 2015-10-15 (×6): qty 1

## 2015-10-15 MED ORDER — OXYTOCIN 10 UNIT/ML IJ SOLN
1.0000 m[IU]/min | INTRAVENOUS | Status: DC
  Administered 2015-10-15: 2 m[IU]/min via INTRAVENOUS
  Filled 2015-10-15: qty 4

## 2015-10-15 MED ORDER — ONDANSETRON HCL 4 MG/2ML IJ SOLN: 4.0000 mg | INTRAMUSCULAR | Status: DC | PRN

## 2015-10-15 MED ORDER — ZOLPIDEM TARTRATE 5 MG PO TABS: 5.0000 mg | ORAL_TABLET | Freq: Every evening | ORAL | Status: DC | PRN

## 2015-10-15 MED ORDER — SIMETHICONE 80 MG PO CHEW: 80.0000 mg | CHEWABLE_TABLET | ORAL | Status: DC | PRN

## 2015-10-15 MED ORDER — DIPHENHYDRAMINE HCL 25 MG PO CAPS: 25.0000 mg | ORAL_CAPSULE | Freq: Four times a day (QID) | ORAL | Status: DC | PRN

## 2015-10-15 MED ORDER — METHYLERGONOVINE MALEATE 0.2 MG PO TABS
0.2000 mg | ORAL_TABLET | ORAL | Status: DC | PRN
Start: 2015-10-15 — End: 2015-10-17

## 2015-10-15 MED ORDER — SODIUM CHLORIDE 0.9 % IV SOLN
3.0000 g | Freq: Once | INTRAVENOUS | Status: AC
  Administered 2015-10-15: 3 g via INTRAVENOUS
  Filled 2015-10-15: qty 3

## 2015-10-15 MED ORDER — PRENATAL MULTIVITAMIN CH
1.0000 | ORAL_TABLET | Freq: Every day | ORAL | Status: DC
Start: 2015-10-16 — End: 2015-10-17
  Administered 2015-10-16: 1 via ORAL
  Filled 2015-10-15: qty 1

## 2015-10-15 MED ORDER — METHYLERGONOVINE MALEATE 0.2 MG/ML IJ SOLN: 0.2000 mg | INTRAMUSCULAR | Status: DC | PRN

## 2015-10-15 MED ORDER — LACTATED RINGERS IV SOLN: INTRAVENOUS | Status: DC

## 2015-10-15 MED ORDER — ONDANSETRON HCL 4 MG PO TABS: 4.0000 mg | ORAL_TABLET | ORAL | Status: DC | PRN

## 2015-10-15 MED ORDER — DIBUCAINE 1 % RE OINT
1.0000 "application " | TOPICAL_OINTMENT | RECTAL | Status: DC | PRN
  Administered 2015-10-15: 1 via RECTAL
  Filled 2015-10-15: qty 28

## 2015-10-15 MED ORDER — ACETAMINOPHEN 325 MG PO TABS
650.0000 mg | ORAL_TABLET | ORAL | Status: DC | PRN
  Administered 2015-10-15 – 2015-10-16 (×3): 650 mg via ORAL
  Filled 2015-10-15 (×3): qty 2

## 2015-10-15 MED ORDER — SENNOSIDES-DOCUSATE SODIUM 8.6-50 MG PO TABS
2.0000 | ORAL_TABLET | ORAL | Status: DC
  Administered 2015-10-16 – 2015-10-17 (×2): 2 via ORAL
  Filled 2015-10-15 (×2): qty 2

## 2015-10-15 MED ORDER — TETANUS-DIPHTH-ACELL PERTUSSIS 5-2.5-18.5 LF-MCG/0.5 IM SUSP: 0.5000 mL | Freq: Once | INTRAMUSCULAR | Status: DC

## 2015-10-15 MED ORDER — TERBUTALINE SULFATE 1 MG/ML IJ SOLN
0.2500 mg | Freq: Once | INTRAMUSCULAR | Status: DC | PRN
  Filled 2015-10-15: qty 1

## 2015-10-15 MED ORDER — BENZOCAINE-MENTHOL 20-0.5 % EX AERO
1.0000 | INHALATION_SPRAY | CUTANEOUS | Status: DC | PRN
Start: 2015-10-15 — End: 2015-10-17
  Administered 2015-10-15: 1 via TOPICAL
  Filled 2015-10-15: qty 56

## 2015-10-15 MED ORDER — LANOLIN HYDROUS EX OINT: TOPICAL_OINTMENT | CUTANEOUS | Status: DC | PRN

## 2015-10-15 NOTE — Progress Notes (Signed)
Brandi Day is a 30 y.o. G2P1001 at [redacted]w[redacted]d by LMP admitted for rupture of membranes for TOLAC  Subjective: Comfortable with epidural  Objective: BP 97/54 mmHg  Pulse 72  Temp(Src) 98.4 F (36.9 C) (Oral)  Resp 20  Ht  (1.6 m)  Wt 77.111 kg (170 lb)  BMI 30.12 kg/m2  SpO2 99%  LMP 01/14/2015      FHT:  FHR: 140 bpm, variability: moderate,  accelerations:  Present,  decelerations:  Absent UC:   regular, every 3-5 minutes SVE:   deferred  Labs: Lab Results  Component Value Date   WBC 10.5 10/14/2015   HGB 10.6* 10/14/2015   HCT 31.7* 10/14/2015   MCV 83.6 10/14/2015   PLT 302 10/14/2015    Assessment / Plan: Protracted latent phase  For TOLAC  Labor: Progressing normally Preeclampsia:  no signs or symptoms of toxicity Fetal Wellbeing:  Category I Pain Control:  Epidural I/D:  n/a Anticipated MOD:  Guarded. Started Pitocin  Eriyonna Matsushita J 10/15/2015, 6:41 AM

## 2015-10-15 NOTE — Progress Notes (Signed)
Brandi Day is a 30 y.o. G2P1001 at [redacted]w[redacted]d by LMP admitted for rupture of membranes  Subjective: Occ pressure  Objective: BP 114/70 mmHg  Pulse 78  Temp(Src) 98.6 F (37 C) (Oral)  Resp 16  Ht  (1.6 Brandi)  Wt 77.111 kg (170 lb)  BMI 30.12 kg/m2  SpO2 99%  LMP 01/14/2015 I/O last 3 completed shifts: In: -  Out: 1500 [Urine:1500]    FHT:  FHR: 135 bpm, variability: moderate,  accelerations:  Present,  decelerations:  Absent UC:   regular, every 2 minutes- MVU 100-170 SVE:   Dilation: 6 Effacement (%): 80 Station: -1, 0 Exam by:: Brandi Day rnc  Labs: Lab Results  Component Value Date   WBC 10.5 10/14/2015   HGB 10.6* 10/14/2015   HCT 31.7* 10/14/2015   MCV 83.6 10/14/2015   PLT 302 10/14/2015    Assessment / Plan: Augmentation of labor, progressing well  Prolonged SROM TOLAC Protracted active phase  Labor: Progressing normally Preeclampsia:  no signs or symptoms of toxicity Fetal Wellbeing:  Category I Pain Control:  Epidural I/D:  n/a Anticipated MOD:  guarded  Brandi Day J 10/15/2015, 2:05 PM

## 2015-10-15 NOTE — Progress Notes (Signed)
Brandi Day is a 30 y.o. G2P1001 at [redacted]w[redacted]d by LMP admitted for rupture of membranes  Subjective: comfortable  Objective: BP 113/66 mmHg  Pulse 88  Temp(Src) 97.6 F (36.4 C) (Oral)  Resp 16  Ht  (1.6 m)  Wt 77.111 kg (170 lb)  BMI 30.12 kg/m2  SpO2 99%  LMP 01/14/2015 I/O last 3 completed shifts: In: -  Out: 1500 [Urine:1500]    FHT:  FHR: 135 bpm, variability: moderate,  accelerations:  Present,  decelerations:  Absent UC:   regular, every 2-4 minutes SVE:   4/90/-1 IUPC placed without difficulty  Labs: Lab Results  Component Value Date   WBC 10.5 10/14/2015   HGB 10.6* 10/14/2015   HCT 31.7* 10/14/2015   MCV 83.6 10/14/2015   PLT 302 10/14/2015    Assessment / Plan: Augmentation of labor, progressing well  Labor: Progressing normally Preeclampsia:  no signs or symptoms of toxicity Fetal Wellbeing:  Category I Pain Control:  Epidural I/D:  n/a Anticipated MOD:  guarded approach to VD  Kaliah Haddaway J 10/15/2015, 8:33 AM

## 2015-10-16 DIAGNOSIS — O99019 Anemia complicating pregnancy, unspecified trimester: Secondary | ICD-10-CM

## 2015-10-16 DIAGNOSIS — D62 Acute posthemorrhagic anemia: Secondary | ICD-10-CM | POA: Diagnosis not present

## 2015-10-16 DIAGNOSIS — D72829 Elevated white blood cell count, unspecified: Secondary | ICD-10-CM | POA: Diagnosis not present

## 2015-10-16 DIAGNOSIS — O4290 Premature rupture of membranes, unspecified as to length of time between rupture and onset of labor, unspecified weeks of gestation: Secondary | ICD-10-CM | POA: Diagnosis not present

## 2015-10-16 DIAGNOSIS — D509 Iron deficiency anemia, unspecified: Secondary | ICD-10-CM | POA: Diagnosis present

## 2015-10-16 LAB — CBC
HCT: 27.6 % — ABNORMAL LOW (ref 36.0–46.0)
Hemoglobin: 9 g/dL — ABNORMAL LOW (ref 12.0–15.0)
MCH: 27.4 pg (ref 26.0–34.0)
MCHC: 32.6 g/dL (ref 30.0–36.0)
MCV: 83.9 fL (ref 78.0–100.0)
PLATELETS: 236 10*3/uL (ref 150–400)
RBC: 3.29 MIL/uL — ABNORMAL LOW (ref 3.87–5.11)
RDW: 13.8 % (ref 11.5–15.5)
WBC: 20.4 10*3/uL — ABNORMAL HIGH (ref 4.0–10.5)

## 2015-10-16 MED ORDER — POLYSACCHARIDE IRON COMPLEX 150 MG PO CAPS
150.0000 mg | ORAL_CAPSULE | Freq: Every day | ORAL | Status: DC
  Administered 2015-10-16: 150 mg via ORAL
  Filled 2015-10-16: qty 1

## 2015-10-16 MED ORDER — OXYCODONE-ACETAMINOPHEN 5-325 MG PO TABS: 1.0000 | ORAL_TABLET | ORAL | Status: DC | PRN

## 2015-10-16 NOTE — Anesthesia Postprocedure Evaluation (Signed)
Anesthesia Post Note  Patient: Brandi Day  Procedure(s) Performed: * No procedures listed *  Patient location during evaluation: Mother Baby Anesthesia Type: Epidural Level of consciousness: awake and alert Pain management: satisfactory to patient Vital Signs Assessment: post-procedure vital signs reviewed and stable Respiratory status: respiratory function stable Cardiovascular status: stable Postop Assessment: no headache, no backache, epidural receding, patient able to bend at knees, no signs of nausea or vomiting and adequate PO intake Anesthetic complications: no    Last Vitals:  Filed Vitals:   10/16/15 0057 10/16/15 0519  BP: 105/58 96/53  Pulse: 81 72  Temp: 36.6 C 36.4 C  Resp: 18 18    Last Pain:  Filed Vitals:   10/16/15 0955  PainSc: 4                  [name]

## 2015-10-16 NOTE — Progress Notes (Signed)
PPD #1- SVD  Subjective:   Reports feeling well Tolerating po/ No nausea or vomiting No chills or malaise Bleeding is light Pain controlled with Motrin Up ad lib / ambulatory / voiding without problems Newborn: breastfeeding   Objective:   VS:  VS:  Filed Vitals:   10/15/15 2019 10/15/15 2115 10/16/15 0057 10/16/15 0519  BP: 114/68 110/65 105/58 96/53  Pulse: 98 97 81 72  Temp: 97.8 F (36.6 C) 98.3 F (36.8 C) 97.9 F (36.6 C) 97.5 F (36.4 C)  TempSrc: Oral Oral Oral Oral  Resp: Height:      Weight:      SpO2:        LABS:  Recent Labs  10/14/15 1745 10/16/15 0512  WBC 10.5 20.4*  HGB 10.6* 9.0*  PLT 302 236   Blood type: --/--/AB POS, AB POS (01/29 1745) Rubella: Immune (07/06 0000)   I&O: Intake/Output      01/30 0701 - 01/31 0700 01/31 0701 - 02/01 0700   Urine (mL/kg/hr)     Blood 200 (0.1)    Total Output 200     Net -200          Urine Occurrence 1 x      Physical Exam: Alert and oriented x3 Abdomen: soft, non-tender, non-distended  Fundus: firm, non-tender, U-2 Perineum: intact, no edema, erythema, or bruising Lochia: small Extremities: No edema, no calf pain or tenderness   Assessment:  PPD #1 G2P2002/ S/P: VBAC  Prolonged rupture of membranes, delivered Intrapartum fever Leukocytosis  IDA with compounding ABL anemia  Plan: Watch closely for signs of infection Repeat CBC in am Start Niferex Continue routine post partum orders Anticipate D/C home tomorrow   Donette Larry, N MSN, CNM 10/16/2015, 9:17 AM

## 2015-10-16 NOTE — Lactation Note (Signed)
This note was copied from the chart of Brandi Day. Lactation Consultation Note  Patient Name: Brandi Day Date: 10/16/2015 Reason for consult: Initial assessment Baby at 25 hr of life and mom is worried that baby is not latching. Baby does have a thick tight upper labial frenulum that has an insertion point near the bottom of the gum ridge. Baby likes to suck in her lips. She does have a high palate. Baby will suck finger easily, with a nice peristolic tongue movement. She extends tongue past gum ridge, lifts tongue past midline, and has good lateralization. No lingual frenulum noted at this time. Baby had just spoon fed 2 teaspoons per FOB. Showed mom how to hold baby. Baby did start to cue some but would not maintain latch. Mom stated that is how all feeding have been. Mom requested a NS. Given a #20 but baby did the same thing. Encouraged mom to offer the the breast on demand and if baby is not staying on to try the NS. If baby is still having trouble call out for RN or LC help. Discussed baby behavior, pumping, manual expression, artifical nipples, feeding frequency, baby belly size, voids, wt loss, breast changes, and nipple care. Given lactation handouts. Aware of OP services and support group.      Maternal Data Has patient been taught Hand Expression?: Yes Does the patient have breastfeeding experience prior to this delivery?: Yes  Feeding Feeding Type: Breast Fed Length of feed: 5 min  LATCH Score/Interventions Latch: Repeated attempts needed to sustain latch, nipple held in mouth throughout feeding, stimulation needed to elicit sucking reflex. Intervention(s): Adjust position;Assist with latch;Breast compression  Audible Swallowing: None  Type of Nipple: Everted at rest and after stimulation  Comfort (Breast/Nipple): Soft / non-tender     Hold (Positioning): Assistance needed to correctly position infant at breast and maintain latch. Intervention(s):  Support Pillows;Position options  LATCH Score: 6  Lactation Tools Discussed/Used Tools: Nipple Shields Nipple shield size: 20 WIC Program: No   Consult Status Consult Status: Follow-up Date: 10/17/15 Follow-up type: In-patient    Rulon Eisenmenger 10/16/2015, 6:48 PM

## 2015-10-17 ENCOUNTER — Ambulatory Visit: Payer: Self-pay

## 2015-10-17 LAB — CBC
HCT: 26.1 % — ABNORMAL LOW (ref 36.0–46.0)
Hemoglobin: 8.6 g/dL — ABNORMAL LOW (ref 12.0–15.0)
MCH: 27.7 pg (ref 26.0–34.0)
MCHC: 33 g/dL (ref 30.0–36.0)
MCV: 83.9 fL (ref 78.0–100.0)
PLATELETS: 236 10*3/uL (ref 150–400)
RBC: 3.11 MIL/uL — AB (ref 3.87–5.11)
RDW: 14 % (ref 11.5–15.5)
WBC: 13.6 10*3/uL — AB (ref 4.0–10.5)

## 2015-10-17 MED ORDER — POLYSACCHARIDE IRON COMPLEX 150 MG PO CAPS: 150.0000 mg | ORAL_CAPSULE | Freq: Every day | ORAL | Status: DC

## 2015-10-17 NOTE — Lactation Note (Signed)
This note was copied from the chart of Brandi Day. Lactation Consultation Note  Patient Name: Brandi Day [id number] Date: 10/17/2015 Reason for consult: Follow-up assessment;Other (Comment) (per mom no questions for [facility] and ready for D/C )  Per [facility] RN in am report mom frustrated with breast feeding and gave bottles all night.     Maternal Data    Feeding    LATCH Score/Interventions                      Lactation Tools Discussed/Used     Consult Status Consult Status: Complete Date: 10/17/15    Kathrin Greathouse 10/17/2015, 12:33 PM

## 2015-10-17 NOTE — Discharge Summary (Signed)
Obstetric Discharge Summary  Reason for Admission: onset of labor - desires for TOLAC Prenatal Procedures: none Intrapartum Procedures: spontaneous vaginal delivery Postpartum Procedures: none Complications-Operative and Postpartum: none HEMOGLOBIN  Date Value Ref Range Status  10/17/2015 8.6* 12.0 - 15.0 g/dL Final   HCT  Date Value Ref Range Status  10/17/2015 26.1* 36.0 - 46.0 % Final    Physical Exam:  General: alert, cooperative and no distress Lochia: appropriate Uterine Fundus: firm Incision: healing well DVT Evaluation: No evidence of DVT seen on physical exam.  Discharge Diagnoses: Term Pregnancy-delivered  Discharge Information: Date: 10/17/2015 Activity: pelvic rest Diet: routine Medications: PNV, Ibuprofen and Fioriect Condition: stable Instructions: refer to practice specific booklet Discharge to: home Follow-up Information    Follow up with Brandi Aden, MD. Schedule an appointment as soon as possible for a visit in 6 weeks.   Specialty:  Obstetrics and Gynecology   Contact information:   [address] [place] [address] [phone]       Newborn Data: Live born female  Birth Weight: 6 lb 13.7 oz (3110 g) APGAR: 6, 9  Home with mother.  Brandi Day 10/17/2015, 11:35 AM

## 2015-10-17 NOTE — Progress Notes (Addendum)
PPD #2- SVD  Subjective:   Reports feeling well Tolerating po/ No nausea or vomiting No chills or malaise Bleeding is light Pain controlled with Motrin Up ad lib / ambulatory / voiding without problems Newborn: breastfeeding   Objective:   VS:  VS:  Filed Vitals:   10/16/15 0057 10/16/15 0519 10/16/15 1833 10/17/15 0644  BP: 105/58 96/53 116/68 107/74  Pulse: 81 72 84 73  Temp: 97.9 F (36.6 C) 97.5 F (36.4 C) 97.9 F (36.6 C) 98.2 F (36.8 C)  TempSrc: Oral Oral Oral Oral  Resp: Height:      Weight:      SpO2:        LABS:   Recent Labs  10/16/15 0512 10/17/15 0634  WBC 20.4* 13.6*  HGB 9.0* 8.6*  PLT 236 236   Blood type: --/--/AB POS, AB POS (01/29 1745) Rubella: Immune (07/06 0000)   I&O: Intake/Output      01/31 0701 - 02/01 0700 02/01 0701 - 02/02 0700   Blood     Total Output       Net              Physical Exam: Alert and oriented x3 Abdomen: soft, non-tender, non-distended  Fundus: firm, non-tender, U-2 Perineum: intact, no edema, erythema, or bruising Lochia: small Extremities: No edema, no calf pain or tenderness   Assessment:  PPD #2 G2P2002/ S/P: VBAC  Prolonged rupture of membranes, delivered Intrapartum fever-resolved. No s/s endometritis Leukocytosis -improved IDA with compounding ABL anemia  Plan: Continue Niferex D/C home Fu office 6 weeks   Lenoard Aden MSN, CNM 10/17/2015, 11:25 AM

## 2015-11-08 ENCOUNTER — Other Ambulatory Visit: Payer: Self-pay | Admitting: Certified Nurse Midwife

## 2016-09-15 NOTE — L&D Delivery Note (Signed)
Delivery Note At 10:41 AM a viable and healthy female was delivered via VBAC, Spontaneous (Presentation: LOA ).  APGAR: 9, 9; weight pending.   Placenta status: spontaneous, intact.  Cord:  with the following complications: none  .  Cord pH: na  Anesthesia: epidural   Episiotomy: None Lacerations: None Suture Repair: na Est. Blood Loss (mL): 100  Mom to postpartum.  Baby to Couplet care / Skin to Skin.  Fayette Gasner J 06/09/2017, 11:56 AM

## 2016-11-24 LAB — OB RESULTS CONSOLE HIV ANTIBODY (ROUTINE TESTING): HIV: NONREACTIVE

## 2016-11-24 LAB — OB RESULTS CONSOLE HEPATITIS B SURFACE ANTIGEN: Hepatitis B Surface Ag: NEGATIVE

## 2016-11-24 LAB — OB RESULTS CONSOLE RUBELLA ANTIBODY, IGM: Rubella: IMMUNE

## 2016-11-24 LAB — OB RESULTS CONSOLE RPR: RPR: NONREACTIVE

## 2016-12-02 LAB — OB RESULTS CONSOLE GC/CHLAMYDIA
CHLAMYDIA, DNA PROBE: NEGATIVE
GC PROBE AMP, GENITAL: NEGATIVE

## 2017-05-20 LAB — OB RESULTS CONSOLE GBS: GBS: NEGATIVE

## 2017-06-09 ENCOUNTER — Inpatient Hospital Stay (HOSPITAL_COMMUNITY): Payer: BLUE CROSS/BLUE SHIELD | Admitting: Anesthesiology

## 2017-06-09 ENCOUNTER — Inpatient Hospital Stay (HOSPITAL_COMMUNITY)
Admission: AD | Admit: 2017-06-09 | Discharge: 2017-06-10 | DRG: 775 | Disposition: A | Discharge status: Home / Self Care | Payer: BLUE CROSS/BLUE SHIELD | Source: Ambulatory Visit | Attending: Obstetrics and Gynecology | Admitting: Obstetrics and Gynecology

## 2017-06-09 ENCOUNTER — Encounter (HOSPITAL_COMMUNITY): Payer: Self-pay

## 2017-06-09 DIAGNOSIS — Z23 Encounter for immunization: Secondary | ICD-10-CM

## 2017-06-09 DIAGNOSIS — O9081 Anemia of the puerperium: Secondary | ICD-10-CM | POA: Diagnosis not present

## 2017-06-09 DIAGNOSIS — Z87891 Personal history of nicotine dependence: Secondary | ICD-10-CM

## 2017-06-09 DIAGNOSIS — O34211 Maternal care for low transverse scar from previous cesarean delivery: Principal | ICD-10-CM | POA: Diagnosis present

## 2017-06-09 DIAGNOSIS — O34219 Maternal care for unspecified type scar from previous cesarean delivery: Secondary | ICD-10-CM

## 2017-06-09 DIAGNOSIS — Z3A38 38 weeks gestation of pregnancy: Secondary | ICD-10-CM | POA: Diagnosis not present

## 2017-06-09 DIAGNOSIS — D62 Acute posthemorrhagic anemia: Secondary | ICD-10-CM | POA: Diagnosis not present

## 2017-06-09 LAB — TYPE AND SCREEN
ABO/RH(D): AB POS
ANTIBODY SCREEN: NEGATIVE

## 2017-06-09 LAB — CBC
HEMATOCRIT: 30.8 % — AB (ref 36.0–46.0)
HEMOGLOBIN: 10.3 g/dL — AB (ref 12.0–15.0)
MCH: 27.6 pg (ref 26.0–34.0)
MCHC: 33.4 g/dL (ref 30.0–36.0)
MCV: 82.6 fL (ref 78.0–100.0)
Platelets: 291 10*3/uL (ref 150–400)
RBC: 3.73 MIL/uL — ABNORMAL LOW (ref 3.87–5.11)
RDW: 14.5 % (ref 11.5–15.5)
WBC: 10 10*3/uL (ref 4.0–10.5)

## 2017-06-09 LAB — RPR: RPR Ser Ql: NONREACTIVE

## 2017-06-09 MED ORDER — EPHEDRINE 5 MG/ML INJ
10.0000 mg | INTRAVENOUS | Status: DC | PRN
  Filled 2017-06-09: qty 2

## 2017-06-09 MED ORDER — FENTANYL 2.5 MCG/ML BUPIVACAINE 1/10 % EPIDURAL INFUSION (WH - ANES)
14.0000 mL/h | INTRAMUSCULAR | Status: DC | PRN
  Administered 2017-06-09: 14 mL/h via EPIDURAL
  Filled 2017-06-09: qty 100

## 2017-06-09 MED ORDER — WITCH HAZEL-GLYCERIN EX PADS: 1.0000 "application " | MEDICATED_PAD | CUTANEOUS | Status: DC | PRN

## 2017-06-09 MED ORDER — OXYCODONE-ACETAMINOPHEN 5-325 MG PO TABS: 2.0000 | ORAL_TABLET | ORAL | Status: DC | PRN

## 2017-06-09 MED ORDER — PHENYLEPHRINE 40 MCG/ML (10ML) SYRINGE FOR IV PUSH (FOR BLOOD PRESSURE SUPPORT)
80.0000 ug | PREFILLED_SYRINGE | INTRAVENOUS | Status: DC | PRN
  Filled 2017-06-09: qty 5
  Filled 2017-06-09 (×2): qty 10

## 2017-06-09 MED ORDER — FLEET ENEMA 7-19 GM/118ML RE ENEM: 1.0000 | ENEMA | RECTAL | Status: DC | PRN

## 2017-06-09 MED ORDER — ONDANSETRON HCL 4 MG PO TABS: 4.0000 mg | ORAL_TABLET | ORAL | Status: DC | PRN

## 2017-06-09 MED ORDER — DIBUCAINE 1 % RE OINT: 1.0000 "application " | TOPICAL_OINTMENT | RECTAL | Status: DC | PRN

## 2017-06-09 MED ORDER — ONDANSETRON HCL 4 MG/2ML IJ SOLN: 4.0000 mg | Freq: Four times a day (QID) | INTRAMUSCULAR | Status: DC | PRN

## 2017-06-09 MED ORDER — PHENYLEPHRINE 40 MCG/ML (10ML) SYRINGE FOR IV PUSH (FOR BLOOD PRESSURE SUPPORT)
80.0000 ug | PREFILLED_SYRINGE | INTRAVENOUS | Status: DC | PRN
  Administered 2017-06-09 (×2): 80 ug via INTRAVENOUS
  Filled 2017-06-09: qty 5

## 2017-06-09 MED ORDER — LACTATED RINGERS IV SOLN: 500.0000 mL | Freq: Once | INTRAVENOUS | Status: DC

## 2017-06-09 MED ORDER — OXYCODONE-ACETAMINOPHEN 5-325 MG PO TABS: 1.0000 | ORAL_TABLET | ORAL | Status: DC | PRN

## 2017-06-09 MED ORDER — SENNOSIDES-DOCUSATE SODIUM 8.6-50 MG PO TABS
2.0000 | ORAL_TABLET | ORAL | Status: DC
  Administered 2017-06-09: 2 via ORAL
  Filled 2017-06-09: qty 2

## 2017-06-09 MED ORDER — OXYTOCIN 40 UNITS IN LACTATED RINGERS INFUSION - SIMPLE MED
2.5000 [IU]/h | INTRAVENOUS | Status: DC
  Filled 2017-06-09: qty 1000

## 2017-06-09 MED ORDER — INFLUENZA VAC SPLIT QUAD 0.5 ML IM SUSY
0.5000 mL | PREFILLED_SYRINGE | INTRAMUSCULAR | Status: AC
  Administered 2017-06-10: 0.5 mL via INTRAMUSCULAR

## 2017-06-09 MED ORDER — PRENATAL MULTIVITAMIN CH
1.0000 | ORAL_TABLET | Freq: Every day | ORAL | Status: DC
  Administered 2017-06-10: 1 via ORAL
  Filled 2017-06-09: qty 1

## 2017-06-09 MED ORDER — METHYLERGONOVINE MALEATE 0.2 MG PO TABS: 0.2000 mg | ORAL_TABLET | ORAL | Status: DC | PRN

## 2017-06-09 MED ORDER — ACETAMINOPHEN 325 MG PO TABS: 650.0000 mg | ORAL_TABLET | ORAL | Status: DC | PRN

## 2017-06-09 MED ORDER — DIPHENHYDRAMINE HCL 50 MG/ML IJ SOLN: 12.5000 mg | INTRAMUSCULAR | Status: DC | PRN

## 2017-06-09 MED ORDER — FENTANYL CITRATE (PF) 100 MCG/2ML IJ SOLN
100.0000 ug | INTRAMUSCULAR | Status: DC | PRN
  Administered 2017-06-09: 100 ug via INTRAVENOUS

## 2017-06-09 MED ORDER — LACTATED RINGERS IV SOLN
500.0000 mL | INTRAVENOUS | Status: DC | PRN
  Administered 2017-06-09: 500 mL via INTRAVENOUS

## 2017-06-09 MED ORDER — SOD CITRATE-CITRIC ACID 500-334 MG/5ML PO SOLN: 30.0000 mL | ORAL | Status: DC | PRN

## 2017-06-09 MED ORDER — LIDOCAINE HCL (PF) 1 % IJ SOLN
30.0000 mL | INTRAMUSCULAR | Status: DC | PRN
  Filled 2017-06-09: qty 30

## 2017-06-09 MED ORDER — LACTATED RINGERS IV SOLN
INTRAVENOUS | Status: DC
  Administered 2017-06-09: 06:00:00 via INTRAVENOUS

## 2017-06-09 MED ORDER — LIDOCAINE HCL (PF) 1 % IJ SOLN
INTRAMUSCULAR | Status: DC | PRN
  Administered 2017-06-09: 4 mL

## 2017-06-09 MED ORDER — PHENYLEPHRINE 40 MCG/ML (10ML) SYRINGE FOR IV PUSH (FOR BLOOD PRESSURE SUPPORT)
80.0000 ug | PREFILLED_SYRINGE | INTRAVENOUS | Status: DC | PRN
  Filled 2017-06-09: qty 5

## 2017-06-09 MED ORDER — ONDANSETRON HCL 4 MG/2ML IJ SOLN
4.0000 mg | INTRAMUSCULAR | Status: DC | PRN
Start: 2017-06-09 — End: 2017-06-10

## 2017-06-09 MED ORDER — TETANUS-DIPHTH-ACELL PERTUSSIS 5-2.5-18.5 LF-MCG/0.5 IM SUSP: 0.5000 mL | Freq: Once | INTRAMUSCULAR | Status: DC

## 2017-06-09 MED ORDER — DIPHENHYDRAMINE HCL 25 MG PO CAPS: 25.0000 mg | ORAL_CAPSULE | Freq: Four times a day (QID) | ORAL | Status: DC | PRN

## 2017-06-09 MED ORDER — ZOLPIDEM TARTRATE 5 MG PO TABS: 5.0000 mg | ORAL_TABLET | Freq: Every evening | ORAL | Status: DC | PRN

## 2017-06-09 MED ORDER — COCONUT OIL OIL: 1.0000 "application " | TOPICAL_OIL | Status: DC | PRN

## 2017-06-09 MED ORDER — FENTANYL CITRATE (PF) 100 MCG/2ML IJ SOLN
INTRAMUSCULAR | Status: AC
  Filled 2017-06-09: qty 2

## 2017-06-09 MED ORDER — OXYTOCIN BOLUS FROM INFUSION
500.0000 mL | Freq: Once | INTRAVENOUS | Status: AC
  Administered 2017-06-09: 500 mL via INTRAVENOUS

## 2017-06-09 MED ORDER — BENZOCAINE-MENTHOL 20-0.5 % EX AERO
1.0000 "application " | INHALATION_SPRAY | CUTANEOUS | Status: DC | PRN
  Administered 2017-06-09: 1 via TOPICAL
  Filled 2017-06-09: qty 56

## 2017-06-09 MED ORDER — METHYLERGONOVINE MALEATE 0.2 MG/ML IJ SOLN: 0.2000 mg | INTRAMUSCULAR | Status: DC | PRN

## 2017-06-09 MED ORDER — IBUPROFEN 600 MG PO TABS
600.0000 mg | ORAL_TABLET | Freq: Four times a day (QID) | ORAL | Status: DC
  Administered 2017-06-09 – 2017-06-10 (×4): 600 mg via ORAL
  Filled 2017-06-09 (×4): qty 1

## 2017-06-09 MED ORDER — SIMETHICONE 80 MG PO CHEW: 80.0000 mg | CHEWABLE_TABLET | ORAL | Status: DC | PRN

## 2017-06-09 NOTE — Anesthesia Procedure Notes (Signed)
Epidural Patient location during procedure: OB Start time: 06/09/2017 6:58 AM End time: 06/09/2017 7:04 AM  Staffing Anesthesiologist: Shona Simpson D Performed: anesthesiologist   Preanesthetic Checklist Completed: patient identified, site marked, surgical consent, pre-op evaluation, timeout performed, IV checked, risks and benefits discussed and monitors and equipment checked  Epidural Patient position: sitting Prep: ChloraPrep Patient monitoring: heart rate, continuous pulse ox and blood pressure Approach: midline Location: L3-L4 Injection technique: LOR saline  Needle:  Needle type: Tuohy  Needle gauge: 17 G Needle length: 9 cm Catheter type: closed end flexible Catheter size: 20 Guage Test dose: negative and 1.5% lidocaine  Assessment Events: blood not aspirated, injection not painful, no injection resistance and no paresthesia  Additional Notes LOR @ 5  Patient identified. Risks/Benefits/Options discussed with patient including but not limited to bleeding, infection, nerve damage, paralysis, failed block, incomplete pain control, headache, blood pressure changes, nausea, vomiting, reactions to medications, itching and postpartum back pain. Confirmed with bedside nurse the patient's most recent platelet count. Confirmed with patient that they are not currently taking any anticoagulation, have any bleeding history or any family history of bleeding disorders. Patient expressed understanding and wished to proceed. All questions were answered. Sterile technique was used throughout the entire procedure. Please see nursing notes for vital signs. Test dose was given through epidural catheter and negative prior to continuing to dose epidural or start infusion. Warning signs of high block given to the patient including shortness of breath, tingling/numbness in hands, complete motor block, or any concerning symptoms with instructions to call for help. Patient was given instructions on fall  risk and not to get out of bed. All questions and concerns addressed with instructions to call with any issues or inadequate analgesia.    Reason for block:procedure for pain

## 2017-06-09 NOTE — Anesthesia Preprocedure Evaluation (Signed)
Anesthesia Evaluation  Patient identified by MRN, date of birth, ID band Patient awake    Reviewed: Allergy & Precautions, Patient's Chart, lab work & pertinent test results  Airway Mallampati: II       Dental   Pulmonary neg pulmonary ROS, former smoker,    Pulmonary exam normal        Cardiovascular negative cardio ROS Normal cardiovascular exam     Neuro/Psych negative neurological ROS     GI/Hepatic negative GI ROS, Neg liver ROS,   Endo/Other  negative endocrine ROS  Renal/GU negative Renal ROS     Musculoskeletal negative musculoskeletal ROS (+)   Abdominal   Peds  Hematology negative hematology ROS (+)   Anesthesia Other Findings Day of surgery medications reviewed with the patient.  Reproductive/Obstetrics (+) Pregnancy                             Lab Results  Component Value Date   WBC 10.0 06/09/2017   HGB 10.3 (L) 06/09/2017   HCT 30.8 (L) 06/09/2017   MCV 82.6 06/09/2017   PLT 291 06/09/2017     Anesthesia Physical Anesthesia Plan  ASA: II  Anesthesia Plan: Epidural   Post-op Pain Management:    Induction:   PONV Risk Score and Plan:   Airway Management Planned:   Additional Equipment:   Intra-op Plan:   Post-operative Plan:   Informed Consent: I have reviewed the patients History and Physical, chart, labs and discussed the procedure including the risks, benefits and alternatives for the proposed anesthesia with the patient or authorized representative who has indicated his/her understanding and acceptance.     Plan Discussed with:   Anesthesia Plan Comments:         Anesthesia Quick Evaluation

## 2017-06-09 NOTE — Anesthesia Postprocedure Evaluation (Signed)
Anesthesia Post Note  Patient: Brandi Day  Procedure(s) Performed: * No procedures listed *     Patient location during evaluation: Mother Baby Anesthesia Type: Epidural Level of consciousness: awake and alert and oriented Pain management: pain level controlled Vital Signs Assessment: post-procedure vital signs reviewed and stable Respiratory status: spontaneous breathing and nonlabored ventilation Cardiovascular status: stable Postop Assessment: no headache, patient able to bend at knees, no backache, no apparent nausea or vomiting, epidural receding and adequate PO intake Anesthetic complications: no    Last Vitals:  Vitals:   06/09/17 1335 06/09/17 1802  BP: 125/62 (!) 114/54  Pulse: 94 82  Resp: 18 18  Temp: 36.8 C 37 C  SpO2:      Last Pain:  Vitals:   06/09/17 1802  TempSrc: Axillary  PainSc:    Pain Goal: Patients Stated Pain Goal: 3 (06/09/17 1335)               Rashan Patient Hristova

## 2017-06-09 NOTE — Lactation Note (Signed)
This note was copied from a baby's chart. Lactation Consultation Note  Patient Name: Brandi Day [id number] Date: 06/09/2017 Reason for consult: Follow-up assessment   P3, Baby 8 hours old.  Reviewed hand expression.  Drops expressed bilaterally. Gave baby drops on spoon. Noted baby has short anterior lingual frenulum. Assisted w/ latching in football hold.  Baby breastfed for approx 25 min on both sides. Intermittent sucks and swallows observed. Demonstrated how to flange bottom lip. Encouraged STS, hand expression and waking for feedings by unwrapping and spoon feeding.     Maternal Data Does the patient have breastfeeding experience prior to this delivery?: Yes  Feeding Feeding Type: Breast Fed Length of feed: 25 min  LATCH Score Latch: Grasps breast easily, tongue down, lips flanged, rhythmical sucking.  Audible Swallowing: A few with stimulation  Type of Nipple: Everted at rest and after stimulation  Comfort (Breast/Nipple): Soft / non-tender  Hold (Positioning): Assistance needed to correctly position infant at breast and maintain latch.  LATCH Score: 8  Interventions Interventions: Breast feeding basics reviewed;Skin to skin;Support pillows;Position options;Expressed milk  Lactation Tools Discussed/Used     Consult Status Consult Status: Follow-up Date: 06/10/17 Follow-up type: In-patient    Dahlia Byes [facility] 06/09/2017, 7:16 PM

## 2017-06-09 NOTE — Anesthesia Postprocedure Evaluation (Signed)
Anesthesia Post Note  Patient: Brandi Day  Procedure(s) Performed: * No procedures listed *     Patient location during evaluation: Mother Baby Anesthesia Type: Epidural Level of consciousness: awake and alert Pain management: pain level controlled Vital Signs Assessment: post-procedure vital signs reviewed and stable Respiratory status: spontaneous breathing, nonlabored ventilation and respiratory function stable Cardiovascular status: stable Postop Assessment: no headache, no backache and epidural receding Anesthetic complications: no    Last Vitals:  Vitals:   06/09/17 1115 06/09/17 1130  BP: 112/64 116/74  Pulse: 92 99  Resp:    Temp: 36.8 C   SpO2:      Last Pain:  Vitals:   06/09/17 1115  TempSrc: Oral  PainSc:    Pain Goal:                 Letrice Pollok

## 2017-06-09 NOTE — H&P (Signed)
Brandi Day is a 31 y.o. female presenting for labor. OB History    Gravida Para Term Preterm AB Living   SAB TAB Ectopic Multiple Live Births         0 2     Past Medical History:  Diagnosis Date  . Medical history non-contributory   . Postpartum care following vaginal delivery (1/30) 10/15/2015   Past Surgical History:  Procedure Laterality Date  . CESAREAN SECTION    . WISDOM TOOTH EXTRACTION     Family History: family history includes Cancer in her mother; Diabetes in her father; Hypertension in her father. Social History:  reports that she has quit smoking. She started smoking about 6 years ago. She has never used smokeless tobacco. She reports that she drinks alcohol. She reports that she does not use drugs.     Maternal Diabetes: No Genetic Screening: Normal Maternal Ultrasounds/Referrals: Normal Fetal Ultrasounds or other Referrals:  None Maternal Substance Abuse:  No Significant Maternal Medications:  None Significant Maternal Lab Results:  None Other Comments:  None  Review of Systems  Constitutional: Negative.   All other systems reviewed and are negative.  Maternal Medical History:  Reason for admission: Contractions.   Contractions: Onset was 1-2 hours ago.   Frequency: regular.   Perceived severity is moderate.    Fetal activity: Perceived fetal activity is normal.   Last perceived fetal movement was within the past hour.    Prenatal complications: no prenatal complications Prenatal Complications - Diabetes: none.    Dilation: 3 Effacement (%): 70 Station: -2 Exam by:: Pharmacologist Blood pressure 138/82, temperature 98.2 F (36.8 C), temperature source Oral, resp. rate 18, unknown if currently breastfeeding. Maternal Exam:  Uterine Assessment: Contraction strength is firm.  Contraction frequency is irregular.   Abdomen: Patient reports no abdominal tenderness. Surgical scars: low transverse.   Fetal presentation:  vertex  Introitus: Normal vulva. Ferning test: not done.  Nitrazine test: not done. Amniotic fluid character: not assessed.  Pelvis: adequate for delivery.   Cervix: Cervix evaluated by digital exam.     Physical Exam  Nursing note and vitals reviewed. Constitutional: She is oriented to person, place, and time. She appears well-developed and well-nourished.  HENT:  Head: Normocephalic and atraumatic.  Neck: Normal range of motion. Neck supple.  Cardiovascular: Normal rate and regular rhythm.   Respiratory: Effort normal and breath sounds normal.  GI: Soft. Bowel sounds are normal.  Genitourinary: Vagina normal and uterus normal.  Musculoskeletal: Normal range of motion.  Neurological: She is alert and oriented to person, place, and time. She has normal reflexes.  Skin: Skin is warm and dry.  Psychiatric: She has a normal mood and affect.    Prenatal labs: ABO, Rh:   Antibody:   Rubella:   RPR:    HBsAg:    HIV:    GBS:     Assessment/Plan: Term IUP History of csection with successful VBAC Admit   Demetry Bendickson J 06/09/2017, 6:23 AM

## 2017-06-09 NOTE — Lactation Note (Signed)
This note was copied from a baby's chart. Lactation Consultation Note  Patient Name: Brandi Day [id number] Date: 06/09/2017 Reason for consult: Initial assessment Baby at 4 hr of life. Upon entry baby was sleeping in mom's arms. Mom is concerned that baby has only eaten once in lifetime. Discussed baby behavior, feeding frequency, baby belly size, voids, wt loss, breast changes, and nipple care. Demonstrated manual expression, colostrum noted bilaterally, spoon in room. There is a Harmony at the bedside that has not been opened. Mom does not know why she has it because she has a DEBP at home. Given lactation handouts. Aware of OP services and support group. Left lactation phone number on the white board for mom to call for help with latch as needed.      Maternal Data Has patient been taught Hand Expression?: Yes Does the patient have breastfeeding experience prior to this delivery?: Yes  Feeding Feeding Type: Breast Fed Length of feed: 10 min (sleepy at the breast)  LATCH Score Latch: Grasps breast easily, tongue down, lips flanged, rhythmical sucking.  Audible Swallowing: A few with stimulation  Type of Nipple: Flat  Comfort (Breast/Nipple): Soft / non-tender  Hold (Positioning): Assistance needed to correctly position infant at breast and maintain latch.  LATCH Score: 7  Interventions Interventions: Breast feeding basics reviewed;Assisted with latch;Skin to skin;Breast massage;Hand express;Support pillows  Lactation Tools Discussed/Used     Consult Status Consult Status: Follow-up Date: 06/10/17 Follow-up type: In-patient    Rulon Eisenmenger 06/09/2017, 3:01 PM

## 2017-06-09 NOTE — MAU Note (Signed)
Pt presents to MAU with contractions that started at 4 am. Pt denies LOF and vaginal bleeding. Decreased FM since contractions started.

## 2017-06-09 NOTE — Addendum Note (Signed)
Addendum  created 06/09/17 1901 by Elgie Congo, CRNA   Charge Capture section accepted, Sign clinical note, Visit diagnoses modified

## 2017-06-10 LAB — CBC
HEMATOCRIT: 26.6 % — AB (ref 36.0–46.0)
HEMOGLOBIN: 8.9 g/dL — AB (ref 12.0–15.0)
MCH: 27.8 pg (ref 26.0–34.0)
MCHC: 33.5 g/dL (ref 30.0–36.0)
MCV: 83.1 fL (ref 78.0–100.0)
Platelets: 222 10*3/uL (ref 150–400)
RBC: 3.2 MIL/uL — AB (ref 3.87–5.11)
RDW: 14.7 % (ref 11.5–15.5)
WBC: 10.5 10*3/uL (ref 4.0–10.5)

## 2017-06-10 LAB — BIRTH TISSUE RECOVERY COLLECTION (PLACENTA DONATION)

## 2017-06-10 MED ORDER — IBUPROFEN 600 MG PO TABS: 600.0000 mg | ORAL_TABLET | Freq: Four times a day (QID) | ORAL | 0 refills | Status: DC

## 2017-06-10 MED ORDER — FERROUS SULFATE 325 (65 FE) MG PO TABS: 325.0000 mg | ORAL_TABLET | Freq: Two times a day (BID) | ORAL | 3 refills | Status: AC

## 2017-06-10 MED ORDER — FERROUS SULFATE 325 (65 FE) MG PO TABS: 325.0000 mg | ORAL_TABLET | Freq: Two times a day (BID) | ORAL | Status: DC

## 2017-06-10 MED ORDER — MAGNESIUM OXIDE 400 (241.3 MG) MG PO TABS
400.0000 mg | ORAL_TABLET | Freq: Every day | ORAL | Status: DC
  Administered 2017-06-10: 400 mg via ORAL
  Filled 2017-06-10 (×2): qty 1

## 2017-06-10 NOTE — Lactation Note (Signed)
This note was copied from a baby's chart. Lactation Consultation Note  Patient Name: Brandi Day [id number] Date: 06/10/2017 Reason for consult: Follow-up assessment   Baby 22 hours old.  Mother feels milk is transitioning and states baby is latching well. She did have a hard time waking him last night.  Reviewed waking techniques and described cluster feeding for tonight. Mom encouraged to feed baby 8-12 times/24 hours and with feeding cues.  Compress breast during feedings to keep him active and hand express and give him volume on spoon to interest him in latching. Mother has manual pump. Reviewed engorgement care and monitoring voids/stools.    Maternal Data    Feeding Feeding Type: Breast Fed Length of feed: 15 min  LATCH Score Latch:  (did not call for latch scores)                 Interventions    Lactation Tools Discussed/Used     Consult Status Consult Status: Complete    [name] 06/10/2017, 9:19 AM

## 2017-06-10 NOTE — Discharge Summary (Signed)
Obstetric Discharge Summary   Patient Name: Brandi Day DOB: 01-22-1986 MRN: [id number]  Date of Admission: 06/09/2017 Date of Discharge: 06/10/2017 Date of Delivery: 06/09/17 Gestational Age at Delivery: [redacted]w[redacted]d  Primary OB: [facility] - Dr. Billy Coast  Antepartum complications:  - Husband new dx of leukemia during her pregnancy - Prior C/S with hx of successful VBAC Prenatal Labs:  ABO, Rh: AB Positive  Antibody:  Negative Rubella:  Immune RPR:   NR HBsAg:  Negative  HIV:   NR GBS:  Negative Admitting Diagnosis: Labor at term with hx. Of prior VBAC  Secondary Diagnoses: Patient Active Problem List   Diagnosis Date Noted  . Postpartum care following vaginal delivery (9/25) 06/09/2017  . SVD (spontaneous vaginal delivery) 06/09/2017  . Prolonged rupture of membranes 10/16/2015  . Intrapartum fever, delivered 10/16/2015  . Leukocytosis 10/16/2015  . Iron deficiency anemia of pregnancy 10/16/2015  . Acute blood loss anemia 10/16/2015  . Indication for care in labor or delivery 10/14/2015  . NECK PAIN 01/30/2010    Augmentation: None Complications: none  Date of Delivery: 06/09/17 Delivered By: Dr. Billy Coast Delivery Type: VBAC Anesthesia: epidural Placenta: sponatneous Laceration: none Episiotomy: none  Newborn Data: Live born female  Birth Weight: 7 lb 2.3 oz (3240 g) APGAR: 9, 9   Postpartum Course  (Vaginal Delivery): Patient had an uncomplicated postpartum course.  By time of discharge on PPD#1, her pain was controlled on oral pain medications; she had appropriate lochia and was ambulating, voiding without difficulty and tolerating regular diet.  She was deemed stable for discharge to home.     Labs: CBC Latest Ref Rng & Units 06/10/2017 06/09/2017 10/17/2015  WBC 4.0 - 10.5 K/uL 10.5 10.0 13.6(H)  Hemoglobin 12.0 - 15.0 g/dL 2.1(H) 10.3(L) 8.6(L)  Hematocrit 36.0 - 46.0 % 26.6(L) 30.8(L) 26.1(L)  Platelets 150 - 400 K/uL 222 291 236   AB POS  Physical  exam:  BP (!) 114/54   Pulse 82   Temp 98.6 F (37 C) (Axillary)   Resp 18   Ht  (1.6 m)   Wt 80.3 kg (177 lb)   SpO2 99%   BMI 31.35 kg/m  General: alert and no distress Pulm: normal respiratory effort Lochia: appropriate Abdomen: soft, NT Uterine Fundus: firm, below umbilicus Perineum: healing well, no significant erythema, no significant edema Extremities: No evidence of DVT seen on physical exam. No lower extremity edema.  Disposition: stable, discharge to home Baby Feeding: breast milk Baby Disposition: home with mom  Contraception: unsure  Rh Immune globulin given: N/A Rubella vaccine given: N/A Tdap vaccine given in AP or PP setting: 03/24/17 Flu vaccine given in AP or PP setting: not on file   Plan:  Shelaine Frie Kaneko was discharged to home in good condition. Follow-up appointment at [facility] in 6 weeks.  Discharge Instructions: Per After Visit Summary. Activity: Advance as tolerated. Pelvic rest for 6 weeks.  Refer to After Visit Summary Diet: Regular, Heart Healthy Discharge Medications: Allergies as of 06/10/2017   No Known Allergies     Medication List    TAKE these medications   ferrous sulfate 325 (65 FE) MG tablet Take 1 tablet (325 mg total) by mouth 2 (two) times daily with a meal.   ibuprofen 600 MG tablet Commonly known as:  ADVIL,MOTRIN Take 1 tablet (600 mg total) by mouth every 6 (six) hours.   loratadine 10 MG tablet Commonly known as:  CLARITIN Take 10 mg by mouth daily.   prenatal  vitamin w/FE, FA 27-1 MG Tabs tablet Take 1 tablet by mouth daily at 12 noon.            Discharge Care Instructions        Start     Ordered   06/10/17 0000  ferrous sulfate 325 (65 FE) MG tablet  2 times daily with meals    Question:  Supervising Provider  Answer:  Noland Fordyce   06/10/17 1158   06/10/17 0000  ibuprofen (ADVIL,MOTRIN) 600 MG tablet  Every 6 hours    Question:  Supervising Provider  Answer:  Ernestina Penna, Tresa Endo    06/10/17 1158   06/10/17 0000  Diet - low sodium heart healthy     06/10/17 1158   06/10/17 0000  Call MD for:  extreme fatigue     06/10/17 1158   06/10/17 0000  Call MD for:  persistant dizziness or light-headedness     06/10/17 1158   06/10/17 0000  Call MD for:  hives     06/10/17 1158   06/10/17 0000  Call MD for:  difficulty breathing, headache or visual disturbances     06/10/17 1158   06/10/17 0000  Call MD for:  redness, tenderness, or signs of infection (pain, swelling, redness, odor or green/yellow discharge around incision site)     06/10/17 1158   06/10/17 0000  Call MD for:  severe uncontrolled pain     06/10/17 1158   06/10/17 0000  Call MD for:  persistant nausea and vomiting     06/10/17 1158   06/10/17 0000  Call MD for:  temperature >100.4     06/10/17 1158   06/10/17 0000  Call MD for:     06/10/17 1158   06/10/17 0000  Activity as tolerated     06/10/17 1158   06/10/17 0000  Sexual acrtivity    Comments:  No intercourse for 6 weeks   06/10/17 1158   06/09/17 0000  OB RESULT CONSOLE Group B Strep    Comments:  This external order was created through the Results Console.   06/09/17 0904   06/09/17 0000  OB RESULTS CONSOLE GC/Chlamydia    Comments:  This external order was created through the Results Console.   06/09/17 0904   06/09/17 0000  OB RESULTS CONSOLE RPR    Comments:  This external order was created through the Results Console.    06/09/17 0904   06/09/17 0000  OB RESULTS CONSOLE HIV antibody    Comments:  This external order was created through the Results Console.    06/09/17 0904   06/09/17 0000  OB RESULTS CONSOLE Rubella Antibody    Comments:  This external order was created through the Results Console.    06/09/17 0904   06/09/17 0000  OB RESULTS CONSOLE Hepatitis B surface antigen    Comments:  This external order was created through the Results Console.    06/09/17 1610     Outpatient follow up:  Follow-up Information    [facility] on 06/12/2017.   Why:  2:00pm Contact information: Fax:  [phone]       Olivia Mackie, MD. Schedule an appointment as soon as possible for a visit in 6 week(s).   Specialty:  Obstetrics and Gynecology Why:  Postpartum visit Contact information: [address] [place] [address] [phone]           Signed:  Carlean Jews, MSN, CNM [facility]

## 2017-06-10 NOTE — Progress Notes (Addendum)
PPD #1, VBAC, baby boy "Alexander"  S:  Reports feeling tired - she has been by herself as her husband is receiving chemo and abx at [facility] daily.  She wants to be discharged home today              Tolerating po/ No nausea or vomiting / Denies dizziness or SOB             Bleeding is moderate             Pain controlled with Motrin             Up ad lib / ambulatory / voiding QS  Newborn breast feeding going well per patient / Circumcision - not planning  O:               VS: BP (!) 114/54   Pulse 82   Temp 98.6 F (37 C) (Axillary)   Resp 18   Ht  (1.6 m)   Wt 80.3 kg (177 lb)   SpO2 99%   BMI 31.35 kg/m    LABS:              Recent Labs  06/09/17 0605 06/10/17 0535  WBC 10.0 10.5  HGB 10.3* 8.9*  PLT 291 222               Blood type: --/--/AB POS (09/25 0981)  Rubella: Immune (03/12 0000)                     I&O: Intake/Output      09/25 0701 - 09/26 0700 09/26 0701 - 09/27 0700   Urine (mL/kg/hr) 400 (0.2)    Blood 100    Total Output 500     Net -500          Urine Occurrence 1 x                  Physical Exam:             Alert and oriented X3  Lungs: Clear and unlabored  Heart: regular rate and rhythm / no mumurs  Abdomen: soft, non-tender, non-distended              Fundus: firm, non-tender, U+1  Perineum: intact, no significant edema or erythema  Lochia: appropriate, no clots   Extremities: no edema, no calf pain or tenderness    A: PPD # 1, VBAC  ABL Anemia - stable, asymptomatic    Doing well - stable status  P: Routine post partum orders  Discharge home today   WOB discharge book and instructions given   F/u with Dr. Billy Coast in 6 weeks for [facility] visit   Carlean Jews, MSN, CNM [facility]

## 2017-09-12 ENCOUNTER — Encounter (HOSPITAL_COMMUNITY): Payer: Self-pay

## 2020-01-13 ENCOUNTER — Other Ambulatory Visit: Payer: Self-pay | Admitting: Obstetrics and Gynecology

## 2020-01-13 DIAGNOSIS — N6320 Unspecified lump in the left breast, unspecified quadrant: Secondary | ICD-10-CM

## 2020-02-01 ENCOUNTER — Other Ambulatory Visit: Payer: Self-pay

## 2020-02-01 ENCOUNTER — Ambulatory Visit
Admission: RE | Admit: 2020-02-01 | Discharge: 2020-02-01 | Disposition: A | Discharge status: Home / Self Care | Payer: BLUE CROSS/BLUE SHIELD | Source: Ambulatory Visit | Attending: Obstetrics and Gynecology | Admitting: Obstetrics and Gynecology

## 2020-02-01 ENCOUNTER — Ambulatory Visit
Admission: RE | Admit: 2020-02-01 | Discharge: 2020-02-01 | Disposition: A | Discharge status: Home / Self Care | Payer: BC Managed Care – PPO | Source: Ambulatory Visit | Attending: Obstetrics and Gynecology | Admitting: Obstetrics and Gynecology

## 2020-02-01 DIAGNOSIS — N6320 Unspecified lump in the left breast, unspecified quadrant: Secondary | ICD-10-CM

## 2020-11-18 IMAGING — US US BREAST*L* LIMITED INC AXILLA
1 series · 7 of 7 positions shown · non-contrast
Comparison: Previous exam(s).

CLINICAL DATA: Palpable lump in the left breast.

EXAM:
DIGITAL DIAGNOSTIC LEFT MAMMOGRAM WITH CAD AND TOMO
ULTRASOUND LEFT BREAST

[Series 1: us breast*left* limited inc axilla · 0.06mm/px · 7 of 7 slices shown]
[im 1/7]
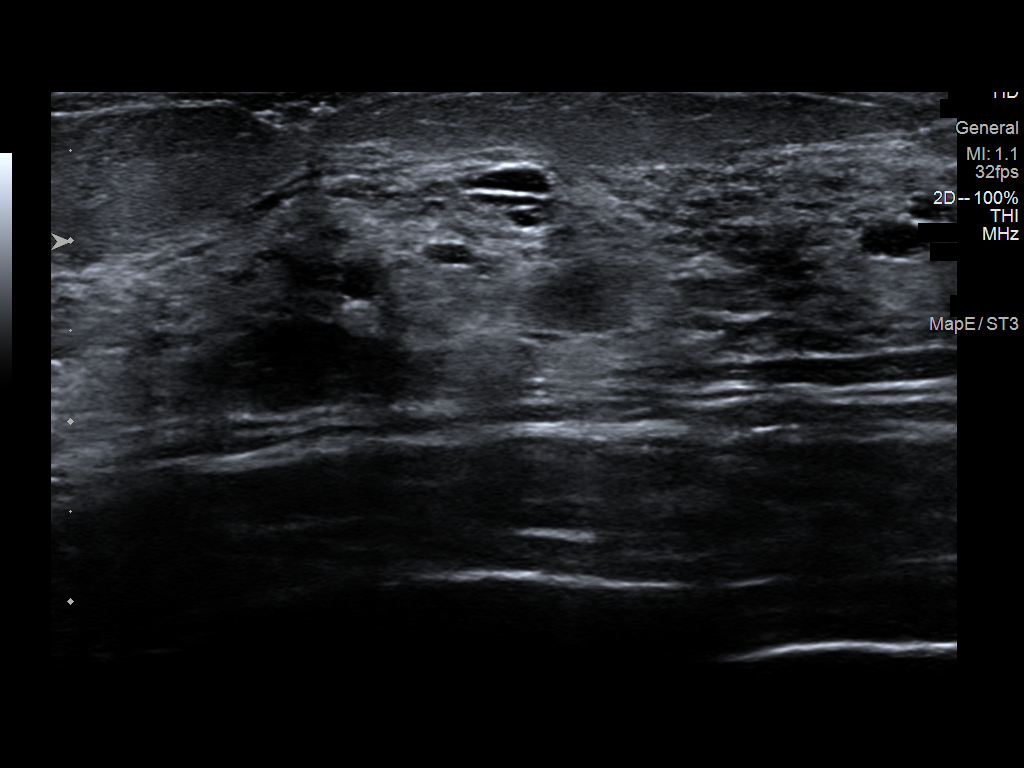
[im 2/7]
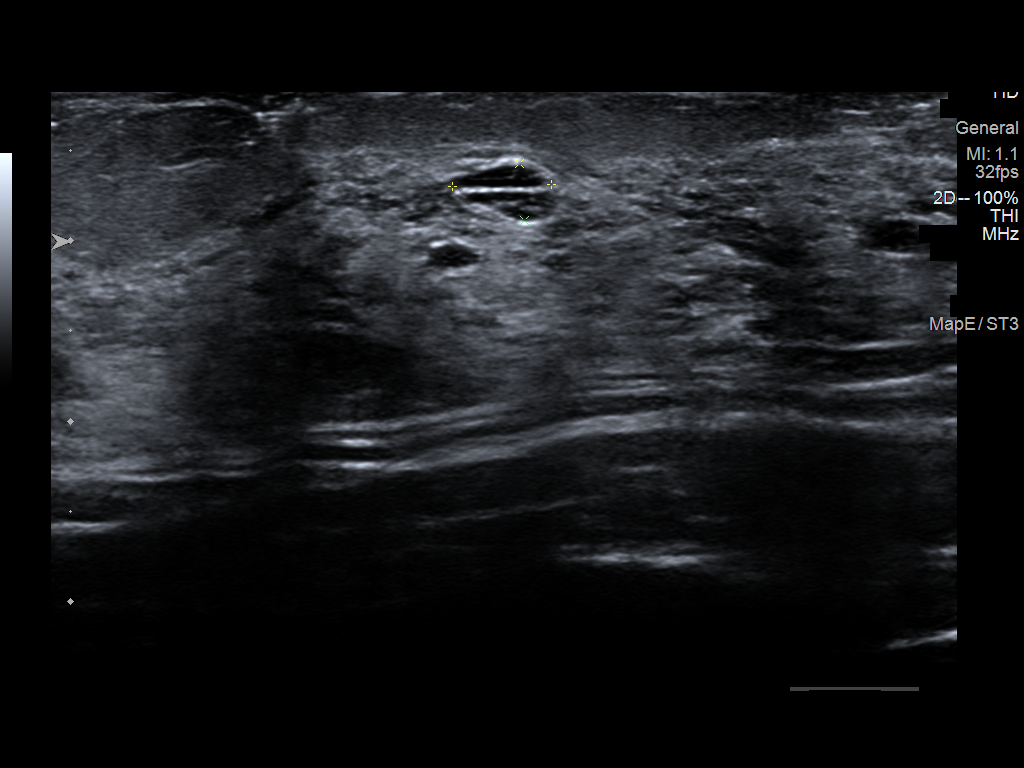
[im 3/7]
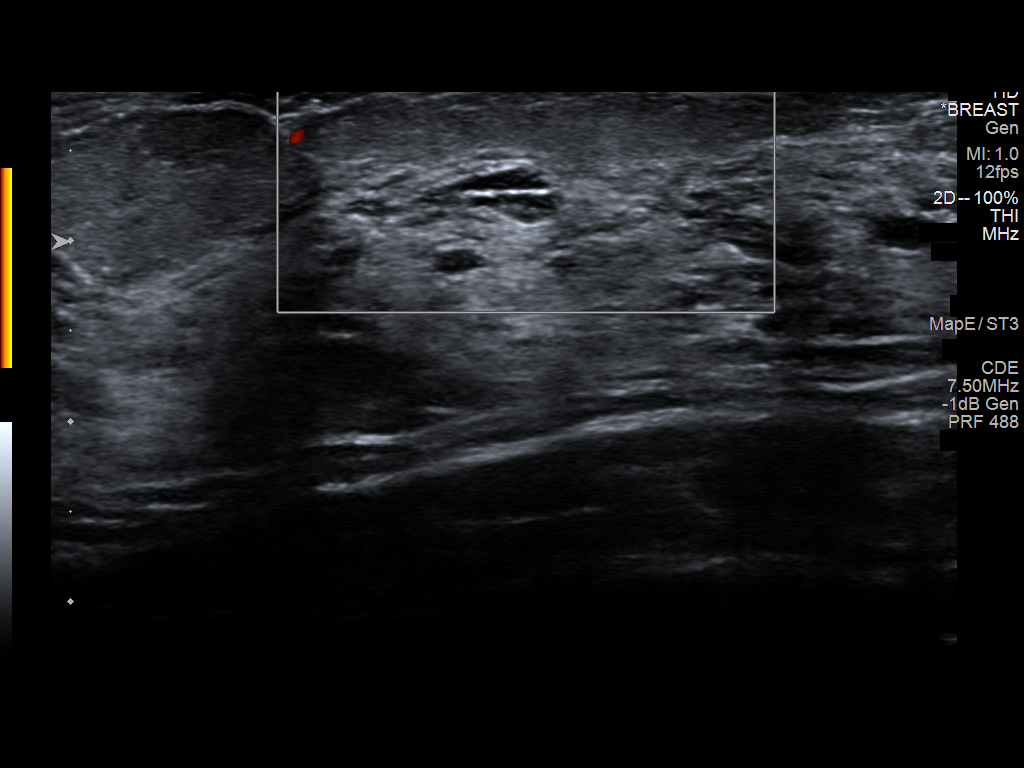
[im 4/7]
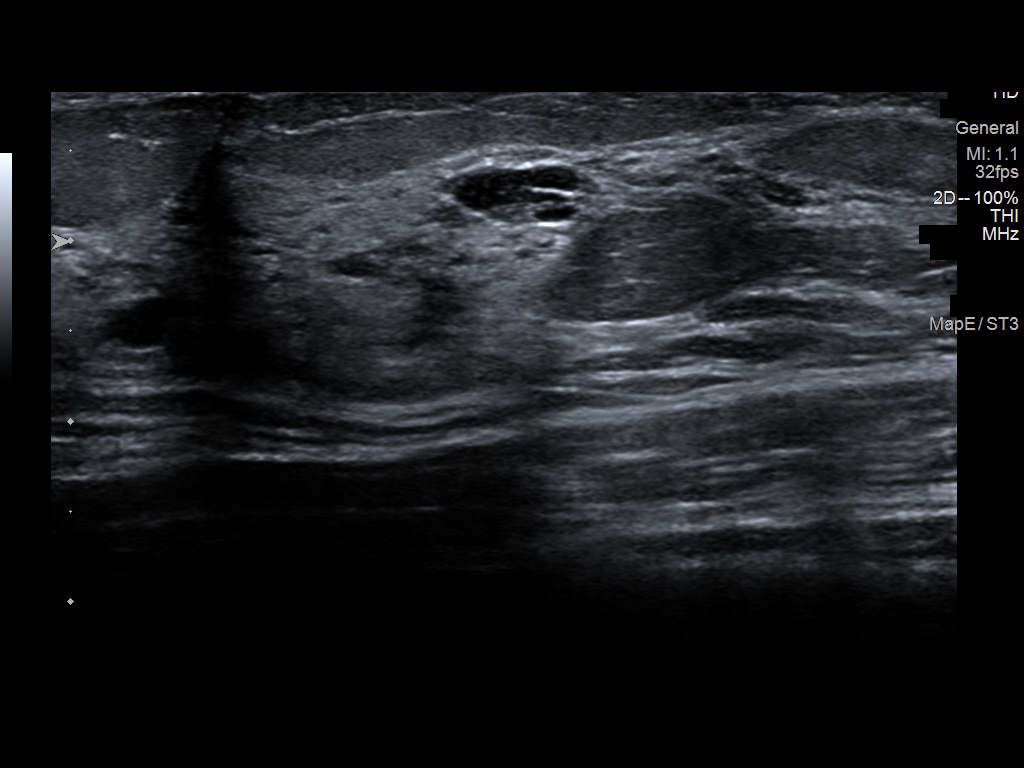
[im 5/7]
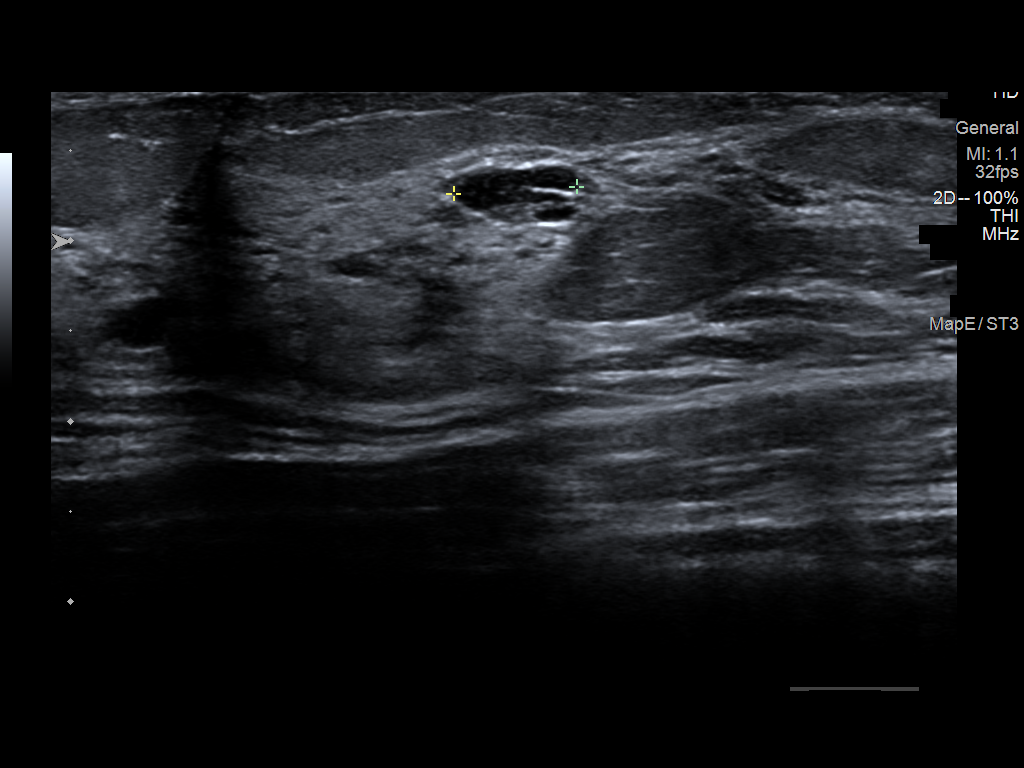
[im 6/7]
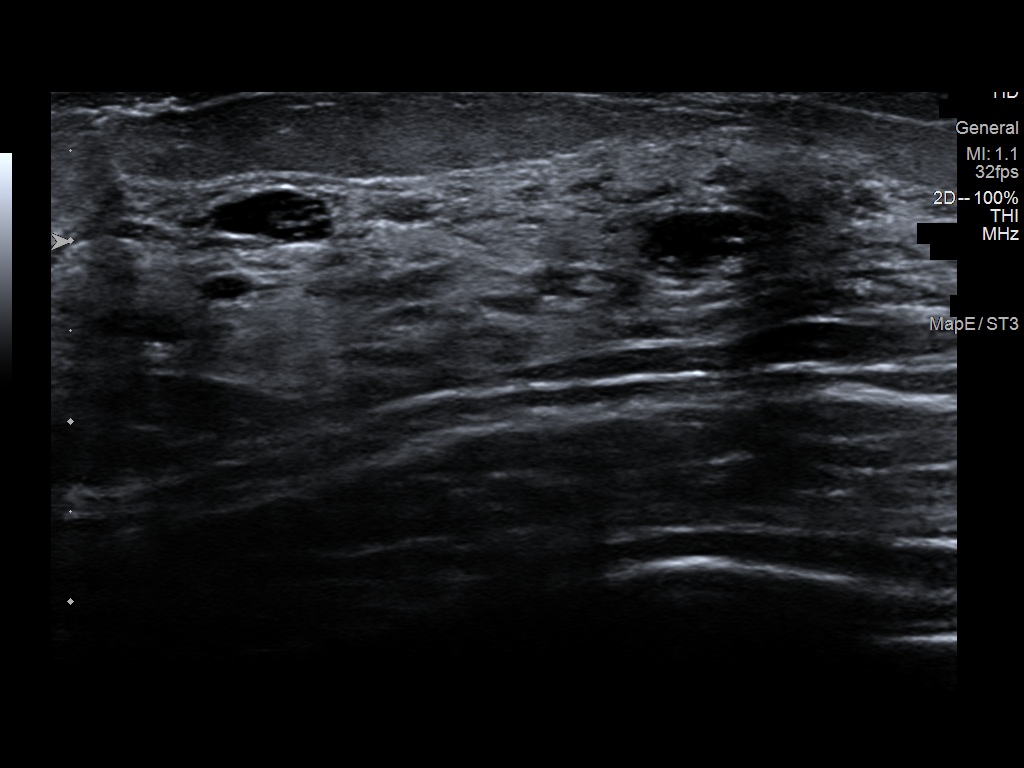
[im 7/7]
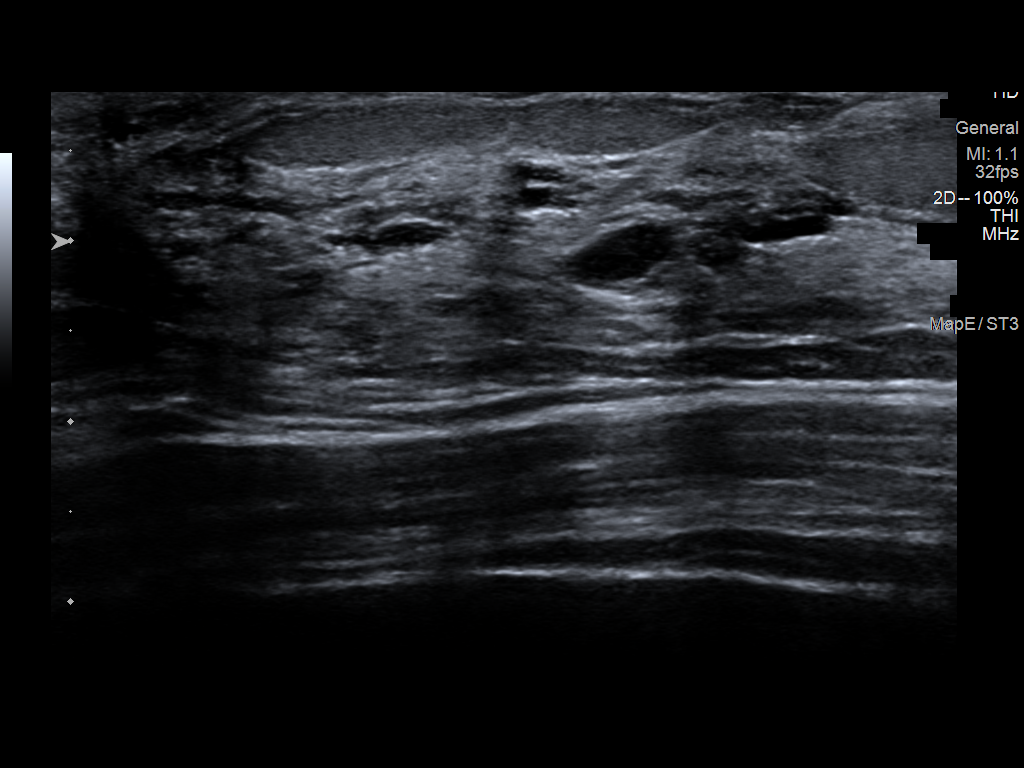

[7 of 7 positions shown; findings below may reference images not displayed]

ACR Breast Density Category c: The breast tissue is heterogeneously
dense, which may obscure small masses.
FINDINGS: No suspicious masses, calcifications, or distortion are identified
in the left breast.

Mammographic images were processed with CAD.

On physical exam, no suspicious lumps are identified.

Targeted ultrasound is performed, showing no sonographic correlate
for the patient's left palpable lump.
IMPRESSION: No mammographic or sonographic evidence of malignancy. No cause for
the patient's left breast lump identified.

RECOMMENDATION:
Treatment of the patient's lump should be based on clinical and
physical exam given lack of imaging findings. Recommend annual
screening mammography.

I have discussed the findings and recommendations with the patient.
If applicable, a reminder letter will be sent to the patient
regarding the next appointment.

BI-RADS CATEGORY  1: Negative.

## 2020-11-18 IMAGING — MG MM DIGITAL DIAGNOSTIC UNILAT*L* W/ TOMO W/ CAD
6 series · 6 of 18 positions shown · non-contrast
Comparison: Previous exam(s).

CLINICAL DATA: Palpable lump in the left breast.

EXAM:
DIGITAL DIAGNOSTIC LEFT MAMMOGRAM WITH CAD AND TOMO
ULTRASOUND LEFT BREAST

[L CC synth-2D]
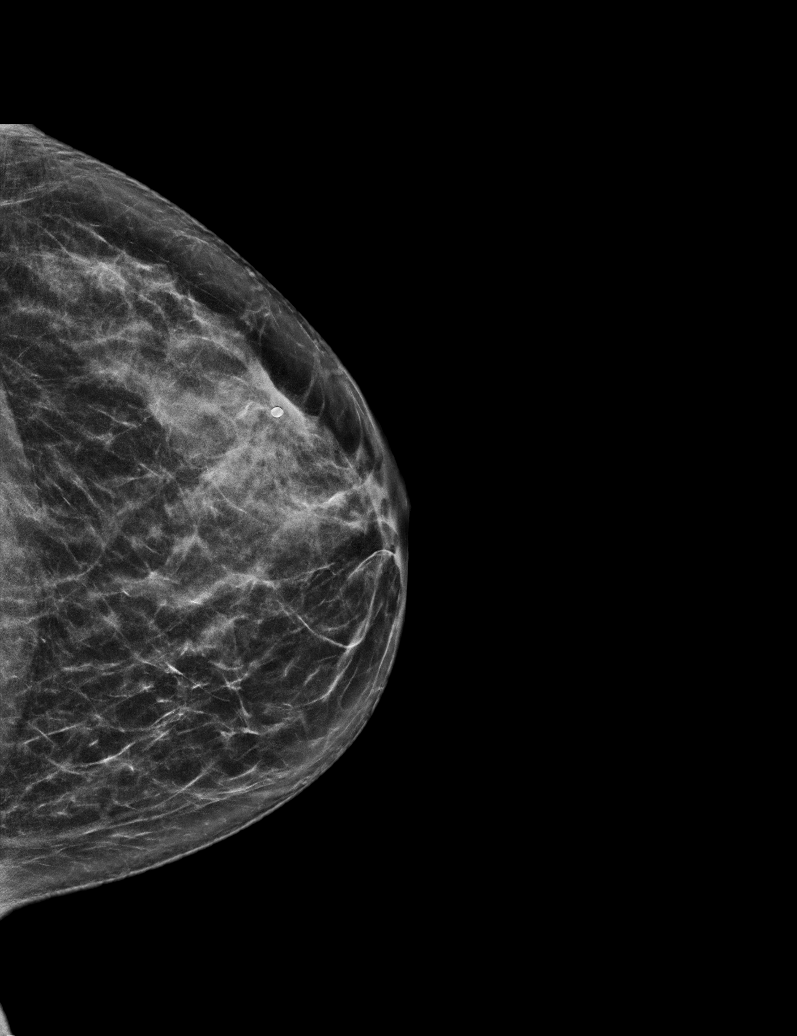

[L MLO synth-2D (1 of 2)]
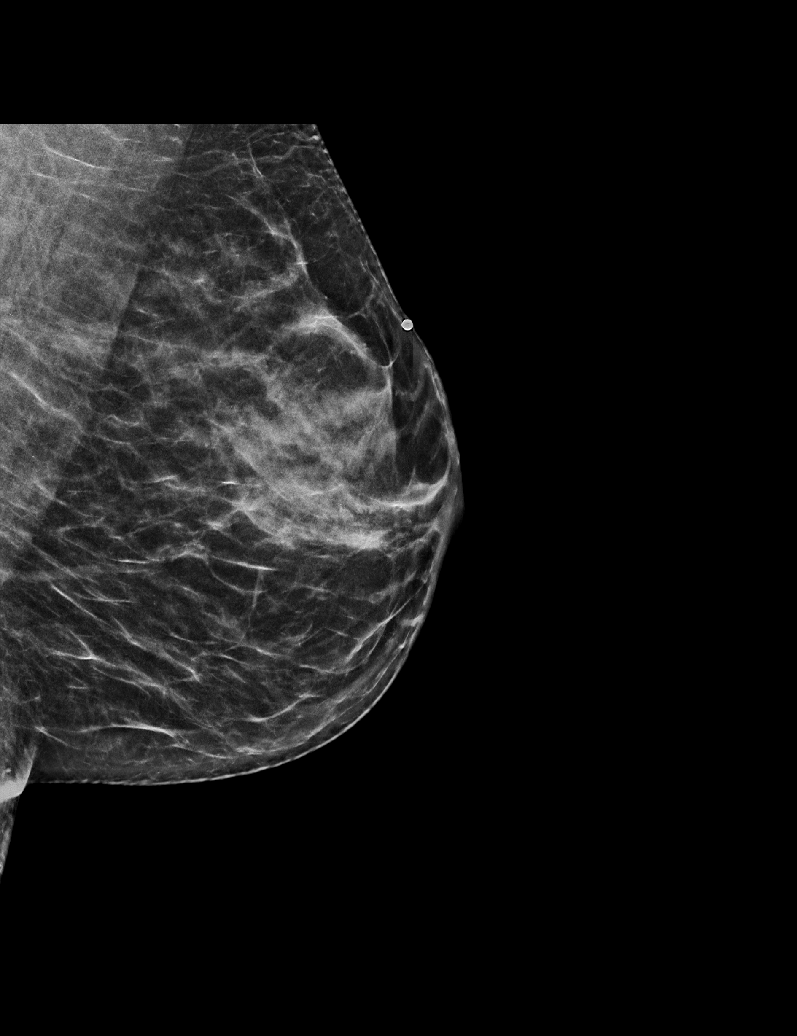

[L MLO synth-2D (2 of 2)]
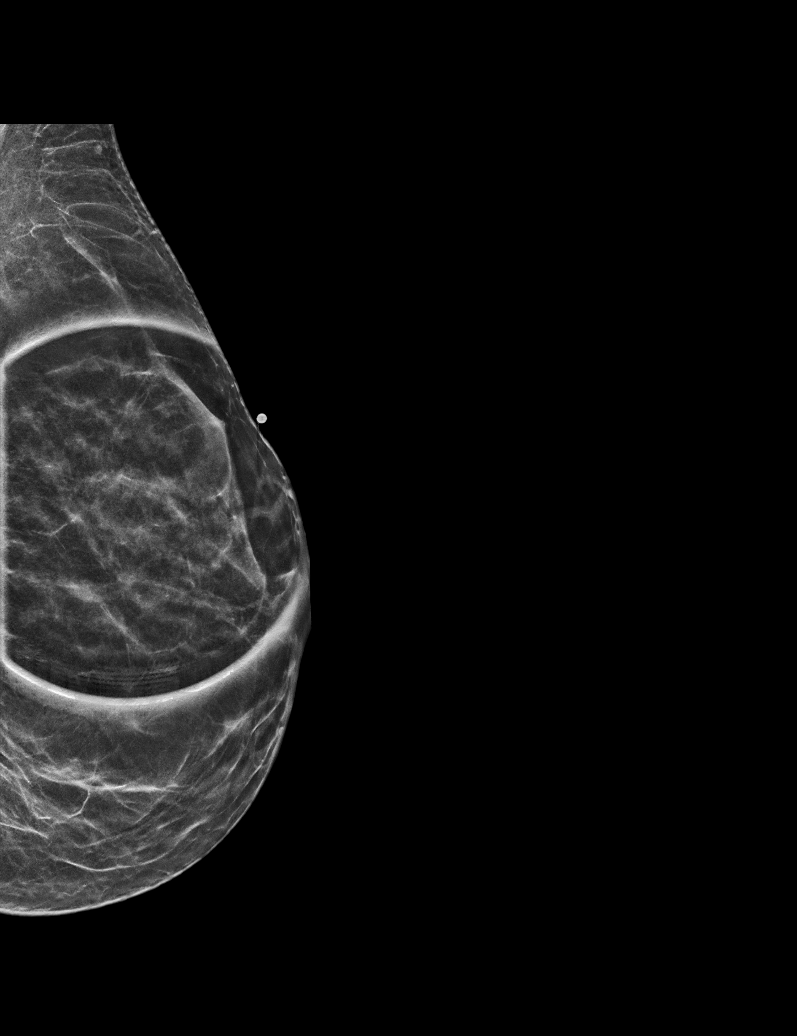

[L MLO tomo (1 of 2) · tomo slice 22/43.0]
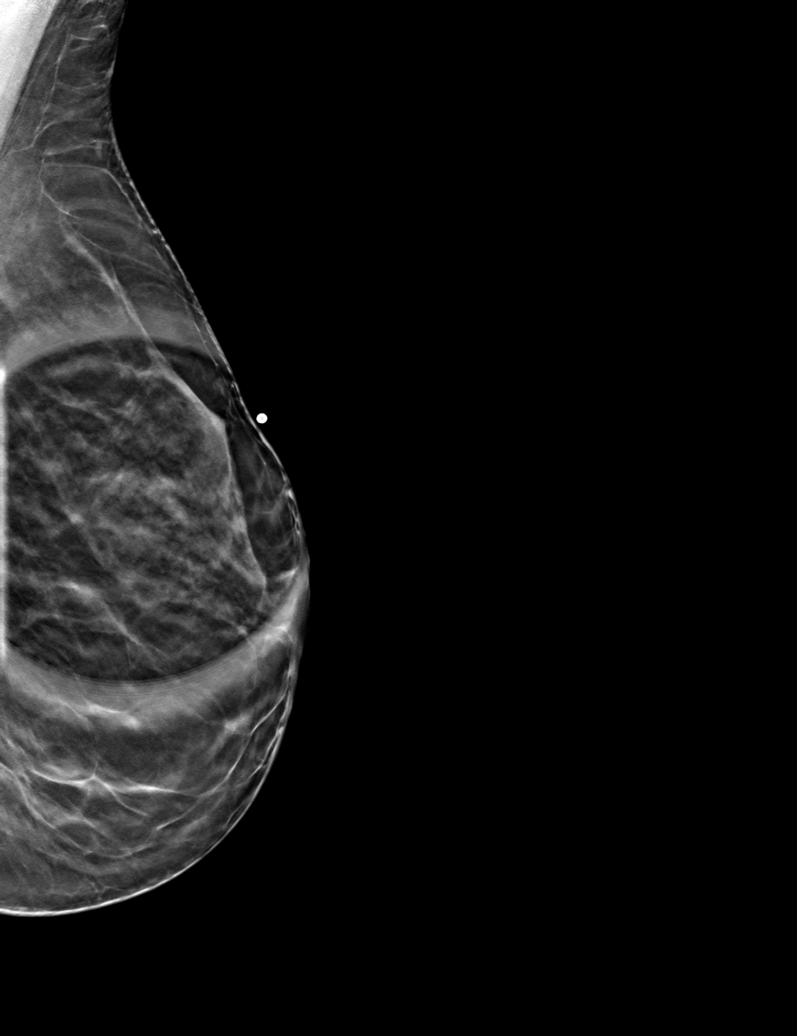

[L CC tomo · tomo slice 27/53.0]
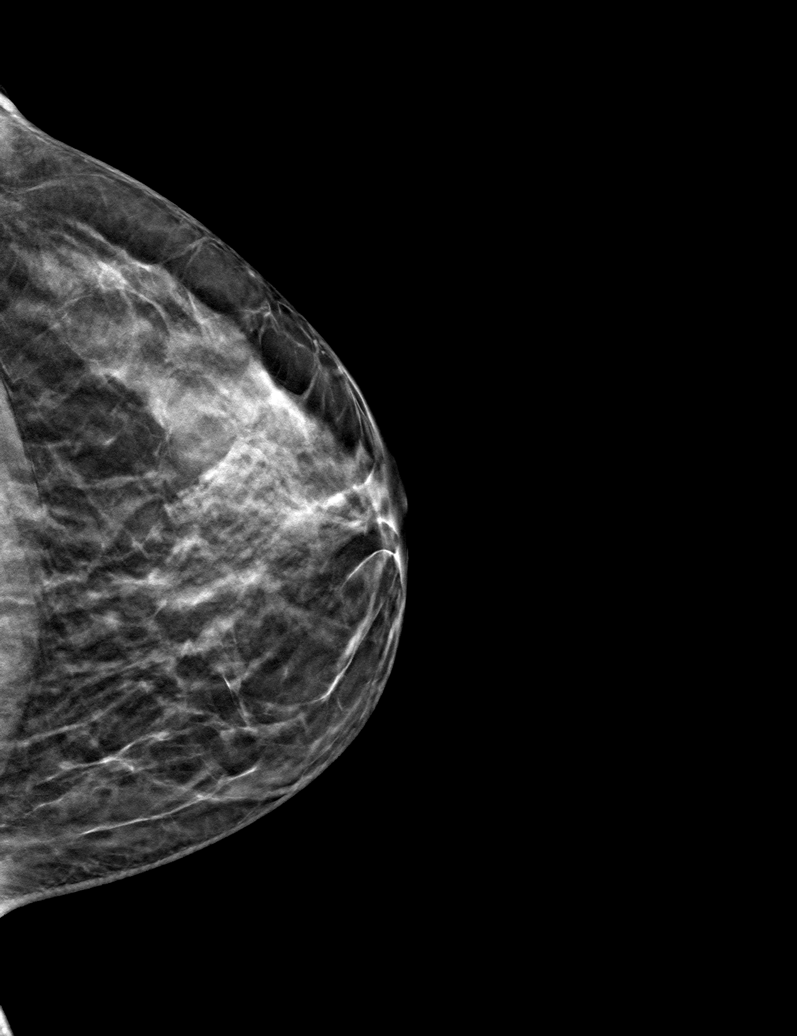

[L MLO tomo (2 of 2) · tomo slice 27/52.0]
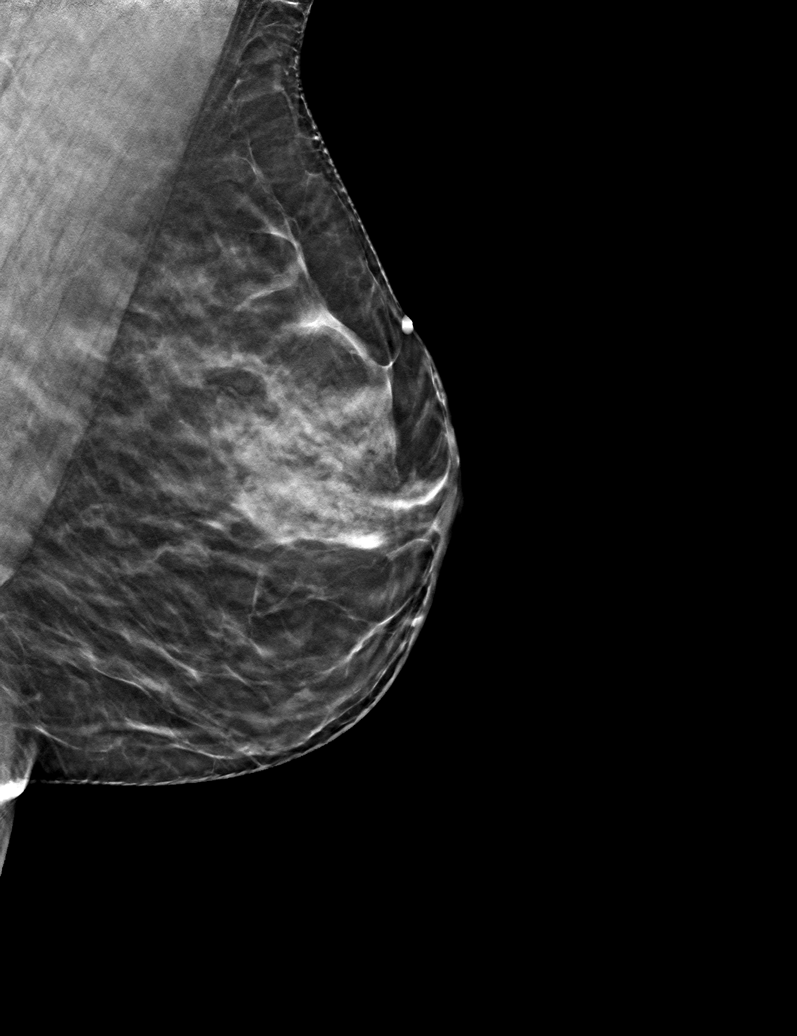

[6 of 18 positions shown; findings below may reference images not displayed]

ACR Breast Density Category c: The breast tissue is heterogeneously
dense, which may obscure small masses.
FINDINGS: No suspicious masses, calcifications, or distortion are identified
in the left breast.

Mammographic images were processed with CAD.

On physical exam, no suspicious lumps are identified.

Targeted ultrasound is performed, showing no sonographic correlate
for the patient's left palpable lump.
IMPRESSION: No mammographic or sonographic evidence of malignancy. No cause for
the patient's left breast lump identified.

RECOMMENDATION:
Treatment of the patient's lump should be based on clinical and
physical exam given lack of imaging findings. Recommend annual
screening mammography.

I have discussed the findings and recommendations with the patient.
If applicable, a reminder letter will be sent to the patient
regarding the next appointment.

BI-RADS CATEGORY  1: Negative.

## 2021-05-13 LAB — OB RESULTS CONSOLE RPR: RPR: NONREACTIVE

## 2021-05-13 LAB — OB RESULTS CONSOLE HEPATITIS B SURFACE ANTIGEN: Hepatitis B Surface Ag: NEGATIVE

## 2021-05-13 LAB — OB RESULTS CONSOLE RUBELLA ANTIBODY, IGM: Rubella: IMMUNE

## 2021-05-13 LAB — OB RESULTS CONSOLE VARICELLA ZOSTER ANTIBODY, IGG: Varicella: IMMUNE

## 2021-06-03 LAB — OB RESULTS CONSOLE GC/CHLAMYDIA
Chlamydia: NEGATIVE
Gonorrhea: NEGATIVE

## 2021-09-15 NOTE — L&D Delivery Note (Addendum)
Operative Delivery Note-VBAC ?At 4:39 PM a viable and healthy female was delivered via Vaginal, [employer], operational).  Presentation: vertex; Position: Left,, Occiput,, Anterior; Station: +3. ? ?FHR 60-80 x , operative delivery recommended.  ? ?Verbal consent: obtained from patient.  Risks and benefits discussed in detail.  Risks include, but are not limited to the risks of anesthesia, bleeding, infection, damage to maternal tissues, fetal cephalhematoma.  There is also the risk of inability to effect vaginal delivery of the head, or shoulder dystocia that cannot be resolved by established maneuvers, leading to the need for emergency cesarean section. ? ?APGAR: 7, 9; weight pending .   ?Placenta status: spontaneous, intact.   ?Cord:  with the following complications: .  Cord pH: na ? ?Anesthesia:  epidural ?Instruments: kiwi x 3 pulls , one pop off ?Episiotomy: None ?Lacerations: None ?Suture Repair:  na ?Est. Blood Loss (mL): 104 ? ?Mom to postpartum.  Baby to Couplet care / Skin to Skin. ? ?Brandi Day J ?12/03/2021, 4:51 PM ? ? ? ?

## 2021-11-05 LAB — OB RESULTS CONSOLE GBS: GBS: NEGATIVE

## 2021-11-28 ENCOUNTER — Telehealth (HOSPITAL_COMMUNITY): Payer: Self-pay | Admitting: *Deleted

## 2021-11-28 ENCOUNTER — Encounter (HOSPITAL_COMMUNITY): Payer: Self-pay | Admitting: *Deleted

## 2021-11-28 NOTE — Telephone Encounter (Signed)
Preadmission screen  

## 2021-12-02 ENCOUNTER — Encounter (HOSPITAL_COMMUNITY): Payer: Self-pay | Admitting: Obstetrics and Gynecology

## 2021-12-02 ENCOUNTER — Telehealth (HOSPITAL_COMMUNITY): Payer: Self-pay | Admitting: *Deleted

## 2021-12-02 ENCOUNTER — Other Ambulatory Visit: Payer: Self-pay | Admitting: Obstetrics and Gynecology

## 2021-12-02 NOTE — Telephone Encounter (Signed)
Preadmission screen  

## 2021-12-03 ENCOUNTER — Other Ambulatory Visit: Payer: Self-pay

## 2021-12-03 ENCOUNTER — Inpatient Hospital Stay (HOSPITAL_COMMUNITY)
Admission: AD | Admit: 2021-12-03 | Discharge: 2021-12-04 | DRG: 807 | Disposition: A | Discharge status: Home / Self Care | Payer: BC Managed Care – PPO | Attending: Obstetrics and Gynecology | Admitting: Obstetrics and Gynecology

## 2021-12-03 ENCOUNTER — Inpatient Hospital Stay (HOSPITAL_COMMUNITY): Payer: BC Managed Care – PPO | Admitting: Anesthesiology

## 2021-12-03 ENCOUNTER — Inpatient Hospital Stay (HOSPITAL_COMMUNITY): Payer: BC Managed Care – PPO

## 2021-12-03 ENCOUNTER — Encounter (HOSPITAL_COMMUNITY): Payer: Self-pay | Admitting: Obstetrics and Gynecology

## 2021-12-03 DIAGNOSIS — Z23 Encounter for immunization: Secondary | ICD-10-CM | POA: Diagnosis not present

## 2021-12-03 DIAGNOSIS — Z87891 Personal history of nicotine dependence: Secondary | ICD-10-CM | POA: Diagnosis not present

## 2021-12-03 DIAGNOSIS — Z3A39 39 weeks gestation of pregnancy: Secondary | ICD-10-CM

## 2021-12-03 DIAGNOSIS — O34219 Maternal care for unspecified type scar from previous cesarean delivery: Secondary | ICD-10-CM | POA: Diagnosis not present

## 2021-12-03 DIAGNOSIS — Z3490 Encounter for supervision of normal pregnancy, unspecified, unspecified trimester: Secondary | ICD-10-CM | POA: Diagnosis present

## 2021-12-03 HISTORY — DX: Unspecified urinary incontinence: R32

## 2021-12-03 HISTORY — DX: Headache, unspecified: R51.9

## 2021-12-03 HISTORY — DX: Anemia, unspecified: D64.9

## 2021-12-03 LAB — CBC
HCT: 36.5 % (ref 36.0–46.0)
Hemoglobin: 12.4 g/dL (ref 12.0–15.0)
MCH: 29.2 pg (ref 26.0–34.0)
MCHC: 34 g/dL (ref 30.0–36.0)
MCV: 86.1 fL (ref 80.0–100.0)
Platelets: 282 10*3/uL (ref 150–400)
RBC: 4.24 MIL/uL (ref 3.87–5.11)
RDW: 13.3 % (ref 11.5–15.5)
WBC: 9.2 10*3/uL (ref 4.0–10.5)
nRBC: 0 % (ref 0.0–0.2)

## 2021-12-03 LAB — RPR: RPR Ser Ql: NONREACTIVE

## 2021-12-03 MED ORDER — DIBUCAINE (PERIANAL) 1 % EX OINT: 1.0000 "application " | TOPICAL_OINTMENT | CUTANEOUS | Status: DC | PRN

## 2021-12-03 MED ORDER — OXYTOCIN BOLUS FROM INFUSION
333.0000 mL | Freq: Once | INTRAVENOUS | Status: AC
Start: 2021-12-03 — End: 2021-12-03
  Administered 2021-12-03: 333 mL via INTRAVENOUS

## 2021-12-03 MED ORDER — COCONUT OIL OIL: 1.0000 "application " | TOPICAL_OIL | Status: DC | PRN

## 2021-12-03 MED ORDER — DIPHENHYDRAMINE HCL 25 MG PO CAPS: 25.0000 mg | ORAL_CAPSULE | Freq: Four times a day (QID) | ORAL | Status: DC | PRN

## 2021-12-03 MED ORDER — SOD CITRATE-CITRIC ACID 500-334 MG/5ML PO SOLN: 30.0000 mL | ORAL | Status: DC | PRN

## 2021-12-03 MED ORDER — METHYLERGONOVINE MALEATE 0.2 MG PO TABS: 0.2000 mg | ORAL_TABLET | ORAL | Status: DC | PRN

## 2021-12-03 MED ORDER — BENZOCAINE-MENTHOL 20-0.5 % EX AERO
1.0000 "application " | INHALATION_SPRAY | CUTANEOUS | Status: DC | PRN
  Administered 2021-12-03: 1 via TOPICAL
  Filled 2021-12-03: qty 56

## 2021-12-03 MED ORDER — LACTATED RINGERS IV SOLN: 500.0000 mL | INTRAVENOUS | Status: DC | PRN

## 2021-12-03 MED ORDER — OXYTOCIN-SODIUM CHLORIDE 30-0.9 UT/500ML-% IV SOLN: 2.5000 [IU]/h | INTRAVENOUS | Status: DC

## 2021-12-03 MED ORDER — LACTATED RINGERS IV SOLN: INTRAVENOUS | Status: DC

## 2021-12-03 MED ORDER — SIMETHICONE 80 MG PO CHEW: 80.0000 mg | CHEWABLE_TABLET | ORAL | Status: DC | PRN

## 2021-12-03 MED ORDER — TETANUS-DIPHTH-ACELL PERTUSSIS 5-2.5-18.5 LF-MCG/0.5 IM SUSY
0.5000 mL | PREFILLED_SYRINGE | Freq: Once | INTRAMUSCULAR | Status: AC
  Administered 2021-12-04: 0.5 mL via INTRAMUSCULAR
  Filled 2021-12-03: qty 0.5

## 2021-12-03 MED ORDER — FENTANYL-BUPIVACAINE-NACL 0.5-0.125-0.9 MG/250ML-% EP SOLN
12.0000 mL/h | EPIDURAL | Status: DC | PRN
  Administered 2021-12-03: 12 mL/h via EPIDURAL
  Filled 2021-12-03: qty 250

## 2021-12-03 MED ORDER — ONDANSETRON HCL 4 MG/2ML IJ SOLN
4.0000 mg | Freq: Four times a day (QID) | INTRAMUSCULAR | Status: DC | PRN
  Administered 2021-12-03: 4 mg via INTRAVENOUS
  Filled 2021-12-03: qty 2

## 2021-12-03 MED ORDER — IBUPROFEN 600 MG PO TABS
600.0000 mg | ORAL_TABLET | Freq: Four times a day (QID) | ORAL | Status: DC
  Administered 2021-12-03 – 2021-12-04 (×4): 600 mg via ORAL
  Filled 2021-12-03 (×5): qty 1

## 2021-12-03 MED ORDER — DIPHENHYDRAMINE HCL 50 MG/ML IJ SOLN: 12.5000 mg | INTRAMUSCULAR | Status: DC | PRN

## 2021-12-03 MED ORDER — ONDANSETRON HCL 4 MG PO TABS: 4.0000 mg | ORAL_TABLET | ORAL | Status: DC | PRN

## 2021-12-03 MED ORDER — WITCH HAZEL-GLYCERIN EX PADS: 1.0000 "application " | MEDICATED_PAD | CUTANEOUS | Status: DC | PRN

## 2021-12-03 MED ORDER — LACTATED RINGERS IV SOLN: 500.0000 mL | Freq: Once | INTRAVENOUS | Status: DC

## 2021-12-03 MED ORDER — ACETAMINOPHEN 325 MG PO TABS
650.0000 mg | ORAL_TABLET | ORAL | Status: DC | PRN
Start: 2021-12-03 — End: 2021-12-05
  Administered 2021-12-03 – 2021-12-04 (×4): 650 mg via ORAL
  Filled 2021-12-03 (×4): qty 2

## 2021-12-03 MED ORDER — PHENYLEPHRINE 40 MCG/ML (10ML) SYRINGE FOR IV PUSH (FOR BLOOD PRESSURE SUPPORT): 80.0000 ug | PREFILLED_SYRINGE | INTRAVENOUS | Status: DC | PRN

## 2021-12-03 MED ORDER — TERBUTALINE SULFATE 1 MG/ML IJ SOLN: 0.2500 mg | Freq: Once | INTRAMUSCULAR | Status: DC | PRN

## 2021-12-03 MED ORDER — OXYTOCIN-SODIUM CHLORIDE 30-0.9 UT/500ML-% IV SOLN
1.0000 m[IU]/min | INTRAVENOUS | Status: DC
  Administered 2021-12-03: 2 m[IU]/min via INTRAVENOUS
  Filled 2021-12-03: qty 500

## 2021-12-03 MED ORDER — SENNOSIDES-DOCUSATE SODIUM 8.6-50 MG PO TABS
2.0000 | ORAL_TABLET | Freq: Every day | ORAL | Status: DC
  Administered 2021-12-04: 2 via ORAL
  Filled 2021-12-03: qty 2

## 2021-12-03 MED ORDER — OXYCODONE-ACETAMINOPHEN 5-325 MG PO TABS: 2.0000 | ORAL_TABLET | ORAL | Status: DC | PRN

## 2021-12-03 MED ORDER — LIDOCAINE HCL (PF) 1 % IJ SOLN: 30.0000 mL | INTRAMUSCULAR | Status: DC | PRN

## 2021-12-03 MED ORDER — ZOLPIDEM TARTRATE 5 MG PO TABS: 5.0000 mg | ORAL_TABLET | Freq: Every evening | ORAL | Status: DC | PRN

## 2021-12-03 MED ORDER — LIDOCAINE HCL (PF) 1 % IJ SOLN
INTRAMUSCULAR | Status: DC | PRN
  Administered 2021-12-03: 7 mL via EPIDURAL
  Administered 2021-12-03: 3 mL via EPIDURAL

## 2021-12-03 MED ORDER — ACETAMINOPHEN 325 MG PO TABS: 650.0000 mg | ORAL_TABLET | ORAL | Status: DC | PRN

## 2021-12-03 MED ORDER — EPHEDRINE 5 MG/ML INJ: 10.0000 mg | INTRAVENOUS | Status: DC | PRN

## 2021-12-03 MED ORDER — OXYCODONE-ACETAMINOPHEN 5-325 MG PO TABS: 1.0000 | ORAL_TABLET | ORAL | Status: DC | PRN

## 2021-12-03 MED ORDER — ONDANSETRON HCL 4 MG/2ML IJ SOLN: 4.0000 mg | INTRAMUSCULAR | Status: DC | PRN

## 2021-12-03 MED ORDER — METHYLERGONOVINE MALEATE 0.2 MG/ML IJ SOLN: 0.2000 mg | INTRAMUSCULAR | Status: DC | PRN

## 2021-12-03 MED ORDER — PRENATAL MULTIVITAMIN CH
1.0000 | ORAL_TABLET | Freq: Every day | ORAL | Status: DC
  Administered 2021-12-04: 1 via ORAL
  Filled 2021-12-03: qty 1

## 2021-12-03 NOTE — H&P (Signed)
Brandi Day is a 36 y.o. female presenting for IOL with prev csection and vbac x 2. ?OB History   ? ? Gravida  ?5  ? Para  ?3  ? Term  ?3  ? Preterm  ?   ? AB  ?1  ? Living  ?3  ?  ? ? SAB  ?1  ? IAB  ?   ? Ectopic  ?   ? Multiple  ?0  ? Live Births  ?3  ?   ?  ?  ? ?Past Medical History:  ?Diagnosis Date  ? Anemia   ? Headache   ? Postpartum care following vaginal delivery (1/30) 10/15/2015  ? Urinary incontinence   ? ?Past Surgical History:  ?Procedure Laterality Date  ? CESAREAN SECTION  2013  ? WISDOM TOOTH EXTRACTION    ? ?Family History: family history includes Cancer in her mother; Diabetes in her father; Heart attack in her brother; Hypertension in her father; Other in her father. ?Social History:  reports that she has quit smoking. Her smoking use included cigarettes. She started smoking about 10 years ago. She has never used smokeless tobacco. She reports current alcohol use. She reports that she does not use drugs. ? ? ?  ?Maternal Diabetes: No ?Genetic Screening: Normal ?Maternal Ultrasounds/Referrals: Normal ?Fetal Ultrasounds or other Referrals:  None ?Maternal Substance Abuse:  No ?Significant Maternal Medications:  None ?Significant Maternal Lab Results:  Group B Strep negative ?Other Comments:  None ? ?Review of Systems  ?Constitutional: Negative.   ?All other systems reviewed and are negative. ?Maternal Medical History:  ?Reason for admission: Contractions.  ? ?Contractions: Onset was 1-2 hours ago.   ?Perceived severity is mild.   ?Fetal activity: Perceived fetal activity is normal.   ?Last perceived fetal movement was within the past hour.   ?Prenatal complications: no prenatal complications ?Prenatal Complications - Diabetes: none. ? ?Dilation: 3.5 ?Effacement (%): Thick ?Station: -3 ?Exam by:: Julieann Drummonds ?Blood pressure 117/80, pulse 98, temperature 98.7 ?F (37.1 ?C), temperature source Oral, resp. rate 18, height 5\' 3"  (1.6 m), weight 80.2 kg, unknown if currently breastfeeding. ?Maternal  Exam:  ?Uterine Assessment: Contraction strength is mild.  Contraction frequency is irregular.  ?Abdomen: Patient reports no abdominal tenderness. Surgical scars: low transverse.   ?Fetal presentation: vertex ?Introitus: Normal vulva. Normal vagina.  Ferning test: not done.  ?Nitrazine test: not done. ?Amniotic fluid character: not assessed. ?Pelvis: adequate for delivery.   ?Cervix: Cervix evaluated by digital exam.   ? ?Physical Exam ?Constitutional:   ?   Appearance: Normal appearance. She is normal weight.  ?HENT:  ?   Head: Normocephalic and atraumatic.  ?Cardiovascular:  ?   Rate and Rhythm: Normal rate and regular rhythm.  ?Pulmonary:  ?   Effort: Pulmonary effort is normal.  ?   Breath sounds: Normal breath sounds.  ?Genitourinary: ?   General: Normal vulva.  ?Musculoskeletal:  ?   Cervical back: Normal range of motion and neck supple.  ?Skin: ?   General: Skin is warm.  ?Neurological:  ?   General: No focal deficit present.  ?   Mental Status: She is alert.  ?Psychiatric:     ?   Mood and Affect: Mood normal.  ?  ?Prenatal labs: ?ABO, Rh: --/--/PENDING (03/21 03-05-1991) ?Antibody: PENDING (03/21 0755) ?Rubella: Immune (08/29 0000) ?RPR: Nonreactive (08/29 0000)  ?HBsAg: Negative (08/29 0000)  ?HIV: Non-reactive (08/29 0000)  ?GBS: Negative/-- (02/21 0000)  ? ?Assessment/Plan: ?Term IUP ?Previous csection with succ  vbac x 2 ?IOL ?Admit, Pitocin, AROM  ? ?Brennen Camper J ?12/03/2021, 8:48 AM ? ? ? ? ?

## 2021-12-03 NOTE — Progress Notes (Signed)
S: ?Comfortable with epidural. Discussed the R/B/A of AROM for labor induction and patient consents to procedure. Husband, Debby Bud, present and supportive. Eagerly anticipating baby boy, "Joelene Millin".  ? ?O: ?Vitals:  ? 12/03/21 1015 12/03/21 1025 12/03/21 1026 12/03/21 1030  ?BP: 122/80  132/79 132/79  ?Pulse: 92  98 92  ?Resp:      ?Temp:      ?TempSrc:      ?SpO2: 98% 100%  100%  ?Weight:      ?Height:      ? ?FHT:  FHR: 135 bpm, variability: moderate,  accelerations:  Present,  decelerations:  Absent ?UC:   regular, every 2-3 minutes ?SVE:   Dilation: 4 ?Effacement (%): 60 ?Station: -2 ?Exam by:: Yetta Barre, CNM ? ?AROM of a small amount of blood tinged fluid at 1028.  ? ?A / P: ?Induction of labor due to TOLAC at term,  progressing well on Pitocin, now AROMed for clear fluid ? ?Fetal Wellbeing:  Category I ?Pain Control:  Epidural ?Anticipated MOD:  NSVD ? ?Continue to increase Pitocin per protocol.  ? ?Dr. Billy Coast updated on patient status. ? ?Clancy Gourd, MSN ?12/03/2021, 10:33 AM ? ?   ?

## 2021-12-03 NOTE — Progress Notes (Addendum)
Brandi Day is a 36 y.o. [id number] at [redacted]w[redacted]d by LMP admitted for TOLAC ? ?Subjective: ?Pressure with contractions ? ?Objective: ?BP (!) 91/56   Pulse 92   Temp 98 ?F (36.7 ?C) (Oral)   Resp 18   Ht 5\' 3"  (1.6 m)   Wt 80.2 kg   SpO2 100%   BMI 31.32 kg/m?  ?No intake/output data recorded. ?Total I/O ?In: 950 [I.V.:950] ?Out: -  ? ?FHT:  FHR: 155 bpm, variability: moderate,  accelerations:  Present,  decelerations:  Absent ?UC:   regular, every 3 minutes ?SVE:   Dilation: 8.5 ?Effacement (%): 90 ?Station: -2 ?Exam by:: Brandi Day ? ?Labs: ?Lab Results  ?Component Value Date  ? WBC 9.2 12/03/2021  ? HGB 12.4 12/03/2021  ? HCT 36.5 12/03/2021  ? MCV 86.1 12/03/2021  ? PLT 282 12/03/2021  ? ? ?Assessment / Plan: ?Induction of labor due to TOLAC,  progressing well on pitocin ? ?Labor: Progressing normally ?Preeclampsia:  no signs or symptoms of toxicity ?Fetal Wellbeing:  Category I ?Pain Control:  Epidural ?I/D:  n/a ?Anticipated MOD:  NSVD ? ?Brandi Day J ?12/03/2021, 4:15 PM ? ? ?

## 2021-12-03 NOTE — Anesthesia Preprocedure Evaluation (Signed)
Anesthesia Evaluation  ?Patient identified by MRN, date of birth, ID band ?Patient awake ? ? ? ?Reviewed: ?Allergy & Precautions, H&P , NPO status , Patient's Chart, lab work & pertinent test results ? ?History of Anesthesia Complications ?Negative for: history of anesthetic complications ? ?Airway ?Mallampati: II ? ?TM Distance: >3 FB ? ? ? ? Dental ?  ?Pulmonary ?neg pulmonary ROS, former smoker,  ?  ?Pulmonary exam normal ? ? ? ? ? ? ? Cardiovascular ?negative cardio ROS ? ? ?Rhythm:regular Rate:Normal ? ? ?  ?Neuro/Psych ?negative neurological ROS ? negative psych ROS  ? GI/Hepatic ?negative GI ROS, Neg liver ROS,   ?Endo/Other  ?negative endocrine ROS ? Renal/GU ?negative Renal ROS  ?negative genitourinary ?  ?Musculoskeletal ? ? Abdominal ?  ?Peds ? Hematology ?negative hematology ROS ?(+)   ?Anesthesia Other Findings ? ? Reproductive/Obstetrics ?(+) Pregnancy (TOLAC, successful VBAC x2) ? ?  ? ? ? ? ? ? ? ? ? ? ? ? ? ?  ?  ? ? ? ? ? ? ? ? ?Anesthesia Physical ?Anesthesia Plan ? ?ASA: 2 ? ?Anesthesia Plan: Epidural  ? ?Post-op Pain Management:   ? ?Induction:  ? ?PONV Risk Score and Plan:  ? ?Airway Management Planned:  ? ?Additional Equipment:  ? ?Intra-op Plan:  ? ?Post-operative Plan:  ? ?Informed Consent: I have reviewed the patients History and Physical, chart, labs and discussed the procedure including the risks, benefits and alternatives for the proposed anesthesia with the patient or authorized representative who has indicated his/her understanding and acceptance.  ? ? ? ? ? ?Plan Discussed with:  ? ?Anesthesia Plan Comments:   ? ? ? ? ? ? ?Anesthesia Quick Evaluation ? ?

## 2021-12-03 NOTE — Anesthesia Procedure Notes (Signed)
Epidural ?Patient location during procedure: OB ?Start time: 12/03/2021 9:36 AM ?End time: 12/03/2021 9:46 AM ? ?Staffing ?Anesthesiologist: Lucretia Kern, MD ?Performed: anesthesiologist  ? ?Preanesthetic Checklist ?Completed: patient identified, IV checked, risks and benefits discussed, monitors and equipment checked, pre-op evaluation and timeout performed ? ?Epidural ?Patient position: sitting ?Prep: DuraPrep ?Patient monitoring: heart rate, continuous pulse ox and blood pressure ?Approach: midline ?Location: L3-L4 ?Injection technique: LOR air ? ?Needle:  ?Needle type: Tuohy  ?Needle gauge: 17 G ?Needle length: 9 cm ?Needle insertion depth: 6 cm ?Catheter type: closed end flexible ?Catheter size: 19 Gauge ?Catheter at skin depth: 11 cm ?Test dose: negative ? ?Assessment ?Events: blood not aspirated, injection not painful, no injection resistance, no paresthesia and negative IV test ? ?Additional Notes ?Reason for block:procedure for pain ? ? ? ?

## 2021-12-04 LAB — CBC
HCT: 31.6 % — ABNORMAL LOW (ref 36.0–46.0)
Hemoglobin: 10.8 g/dL — ABNORMAL LOW (ref 12.0–15.0)
MCH: 29.8 pg (ref 26.0–34.0)
MCHC: 34.2 g/dL (ref 30.0–36.0)
MCV: 87.1 fL (ref 80.0–100.0)
Platelets: 220 10*3/uL (ref 150–400)
RBC: 3.63 MIL/uL — ABNORMAL LOW (ref 3.87–5.11)
RDW: 13.4 % (ref 11.5–15.5)
WBC: 15.8 10*3/uL — ABNORMAL HIGH (ref 4.0–10.5)
nRBC: 0 % (ref 0.0–0.2)

## 2021-12-04 LAB — BIRTH TISSUE RECOVERY COLLECTION (PLACENTA DONATION)

## 2021-12-04 MED ORDER — COCONUT OIL OIL: 1.0000 "application " | TOPICAL_OIL | 0 refills | Status: AC | PRN

## 2021-12-04 MED ORDER — IBUPROFEN 600 MG PO TABS: 600.0000 mg | ORAL_TABLET | Freq: Four times a day (QID) | ORAL | 0 refills | Status: AC

## 2021-12-04 MED ORDER — BENZOCAINE-MENTHOL 20-0.5 % EX AERO: 1.0000 "application " | INHALATION_SPRAY | CUTANEOUS | Status: AC | PRN

## 2021-12-04 MED ORDER — ACETAMINOPHEN 500 MG PO TABS: 1000.0000 mg | ORAL_TABLET | Freq: Four times a day (QID) | ORAL | 2 refills | Status: AC | PRN

## 2021-12-04 NOTE — Lactation Note (Signed)
This note was copied from a baby's chart. ?Lactation Consultation Note ? ?Patient Name: Brandi Day ?Today's Date: 12/04/2021 ?  ?Age:38 hours ?Per RN ( TY) mom declined [facility] services tonight, she will call for [facility] services if she needs latch assistance.  ?Maternal Data ?  ? ?Feeding ?  ? ?LATCH Score ?  ? ?  ? ?  ? ?  ? ?  ? ?  ? ? ?Lactation Tools Discussed/Used ?  ? ?Interventions ?  ? ?Discharge ?  ? ?Consult Status ?  ? ? ? ?Danelle Earthly ?12/04/2021, 12:38 AM ? ? ? ?

## 2021-12-04 NOTE — Discharge Summary (Signed)
?OB Discharge Summary ? ?Patient Name: Brandi Day ?DOB: Oct 21, 1985 ?MRN: [id number] ? ?Date of admission: 12/03/2021 ?Delivering provider: Olivia Mackie  ? ?Admitting diagnosis: Encounter for induction of labor [Z34.90] ?Intrauterine pregnancy: [redacted]w[redacted]d     ?Secondary diagnosis: ?Patient Active Problem List  ? Diagnosis Date Noted  ? Encounter for induction of labor 12/03/2021  ? Postpartum care following vaginal delivery VBAC/VAVD 3/21 06/09/2017  ? VBAC, delivered 06/09/2017  ? ?Additional problems:none  ? ?Date of discharge: 12/04/2021   ?Discharge diagnosis: Principal Problem: ?  Postpartum care following vaginal delivery VBAC/VAVD 3/21 ?Active Problems: ?  VBAC, delivered ?  Encounter for induction of labor ? ?                                                            ?Post partum procedures: none ? ?Augmentation: AROM and Pitocin ?Pain control: Epidural  ?Laceration:None  ?Episiotomy:None  ?Complications: None ? ?Hospital course:  Induction of Labor With Vaginal Delivery   ?36 y.o. yo [id number] at [redacted]w[redacted]d was admitted to the hospital 12/03/2021 for induction of labor.  Indication for induction: Elective.  Patient had an uncomplicated labor course as follows: ?Membrane Rupture Time/Date: 10:28 AM ,12/03/2021   ?Delivery Method:VBAC, Vacuum Assisted  ?Episiotomy: None  ?Lacerations:  None  ?Details of delivery can be found in separate delivery note.  Patient had a routine postpartum course. Patient is discharged home 12/04/21. ? ?Newborn Data: ?Birth date:12/03/2021  ?Birth time:4:39 PM  ?Gender:Female  ?Living status:Living  ?Apgars:8 ,9  ?Weight:3360 g  ? ?Physical exam  ?Vitals:  ? 12/03/21 2000 12/04/21 0100 12/04/21 0542 12/04/21 4010  ?BP: 120/80 105/65 113/75 118/77  ?Pulse: 100 78 85 100  ?Resp: 18 18 18 15   ?Temp: 99.9 ?F (37.7 ?C) 97.9 ?F (36.6 ?C) 98.5 ?F (36.9 ?C) 97.8 ?F (36.6 ?C)  ?TempSrc: Oral Oral Oral Oral  ?SpO2: 96% 98% 98% 98%  ?Weight:      ?Height:      ? ?General: alert, cooperative, and no  distress ?Lochia: appropriate ?Uterine Fundus: firm ?Incision: N/A ?Perineum: intact, no edema ?DVT Evaluation: No cords or calf tenderness. ?No significant calf/ankle edema. ?Labs: ?Lab Results  ?Component Value Date  ? WBC 15.8 (H) 12/04/2021  ? HGB 10.8 (L) 12/04/2021  ? HCT 31.6 (L) 12/04/2021  ? MCV 87.1 12/04/2021  ? PLT 220 12/04/2021  ? ?   ? View : No data to display.  ?  ?  ?  ? ? ?  12/03/2021  ?  6:45 PM 06/10/2017  ?  9:00 AM  ?06/12/2017 Postnatal Depression Scale Screening Tool  ?I have been able to laugh and see the funny side of things. 0 0  ?I have looked forward with enjoyment to things. 0 0  ?I have blamed myself unnecessarily when things went wrong. 1 0  ?I have been anxious or worried for no good reason. 1 0  ?I have felt scared or panicky for no good reason. 0 1  ?Things have been getting on top of me. 0 1  ?I have been so unhappy that I have had difficulty sleeping. 0 0  ?I have felt sad or miserable. 0 0  ?I have been so unhappy that I have been crying. 0 0  ?The thought of harming myself has occurred to  me. 0 0  ?Edinburgh Postnatal Depression Scale Total 2 2  ? ?Vaccines: TDaP          given at discharge ?       COVID-19   UTD ?       Flu             UTD ? ?Discharge instruction:  ?per After Visit Summary,  ?[facility] booklet and  ?"Understanding Mother & Baby Care" hospital booklet ? ?After Visit Meds:  ?Allergies as of 12/04/2021   ? ?   Reactions  ? Gluten Meal Nausea And Vomiting  ? Celiac disease  ? ?  ? ?  ?Medication List  ?  ? ?TAKE these medications   ? ?acetaminophen 500 MG tablet ?Commonly known as: TYLENOL ?Take 2 tablets (1,000 mg total) by mouth every 6 (six) hours as needed. ?  ?benzocaine-Menthol 20-0.5 % Aero ?Commonly known as: DERMOPLAST ?Apply 1 application. topically as needed for irritation (perineal discomfort). ?  ?cetirizine 10 MG tablet ?Commonly known as: ZYRTEC ?Take 10 mg by mouth daily. ?  ?coconut oil Oil ?Apply 1 application. topically as needed. ?   ?diphenhydrAMINE 25 MG tablet ?Commonly known as: BENADRYL ?Take 25 mg by mouth 2 (two) times daily as needed (hives). ?  ?ferrous sulfate 325 (65 FE) MG tablet ?Take 1 tablet (325 mg total) by mouth 2 (two) times daily with a meal. ?What changed: when to take this ?  ?ibuprofen 600 MG tablet ?Commonly known as: ADVIL ?Take 1 tablet (600 mg total) by mouth every 6 (six) hours. ?  ?prenatal vitamin w/FE, FA 27-1 MG Tabs tablet ?Take 1 tablet by mouth daily at 12 noon. ?  ? ?  ? ?  ?  ? ? ?  ?Discharge Care Instructions  ?(From admission, onward)  ?  ? ? ?  ? ?  Start     Ordered  ? 12/04/21 0000  Discharge wound care:       ?Comments: Sitz baths 2 times /day with warm water x 1 week. ?May add herbals: ?1 ounce dried comfrey leaf* ?1 ounce calendula flowers ?1 ounce lavender flowers ? ?Supplies can be found online at [employer] ?Local sources at [employer], [employer] ? ?1/2 ounce dried uva ursi leaves ?1/2 ounce witch hazel blossoms (if you can find them) ?1/2 ounce dried sage leaf ?1/2 cup sea salt ?Directions: Bring 2 quarts of water to a boil. Turn off heat, and place 1 ounce (approximately 1 large handful) of the above mixed herbs (not the salt) into the pot. Steep, covered, for 30 minutes. ? ?Strain the liquid well with a fine mesh strainer, and discard the herb material. Add 2 quarts of liquid to the tub, along with the 1/2 cup of salt. ?This medicinal liquid can also be made into compresses and peri-rinses.  ? 12/04/21 1032  ? ?  ?  ? ?  ? ? ?Diet: routine diet ? ?Activity: Advance as tolerated. Pelvic rest for 6 weeks.  ? ?Postpartum contraception: TBA in office ? ?Newborn Data: ?Live born female  ?Birth Weight: 7 lb 6.5 oz (3360 g) ?APGAR: 8, 9 ? ?Newborn Delivery   ?Birth date/time: 12/03/2021 16:39:00 ?Delivery type: VBAC, Vacuum Assisted ?  ?  ? named Joelene MillinOliver ?Baby Feeding: Breast ?Disposition:home with mother ? ? ? ?Delivery Report: ? ?Review the Delivery Report for details.   ? ?Follow up: ?  Follow-up Information   ? ? Olivia Mackieaavon, Richard, MD. Schedule an appointment as  soon as possible for a visit in 6 week(s).   ?Specialty: Obstetrics and Gynecology ?Why: For Postpartum follow-up ?Contact information: ?[address] ?[place] [address] ?[phone] ? ? ?  ?  ? ?  ?  ? ?  ? ? ? ? ?Signed: ?Cipriano Mile, MSN ?12/04/2021, 10:33 AM ? ? ?

## 2021-12-04 NOTE — Anesthesia Postprocedure Evaluation (Signed)
Anesthesia Post Note ? ?Patient: Brandi Day ? ?Procedure(s) Performed: AN AD HOC LABOR EPIDURAL ? ?  ? ?Patient location during evaluation: Mother Baby ?Anesthesia Type: Epidural ?Level of consciousness: awake and alert and oriented ?Pain management: satisfactory to patient ?Vital Signs Assessment: post-procedure vital signs reviewed and stable ?Respiratory status: respiratory function stable ?Cardiovascular status: stable ?Postop Assessment: no headache, no backache, epidural receding, patient able to bend at knees, no signs of nausea or vomiting, adequate PO intake and able to ambulate ?Anesthetic complications: no ? ? ?No notable events documented. ? ?Last Vitals:  ?Vitals:  ? 12/04/21 0542 12/04/21 0927  ?BP: 113/75 118/77  ?Pulse: 85 100  ?Resp: 18 15  ?Temp: 36.9 ?C 36.6 ?C  ?SpO2: 98% 98%  ?  ?Last Pain:  ?Vitals:  ? 12/04/21 0927  ?TempSrc: Oral  ?PainSc: 3   ? ?Pain Goal:   ? ?  ?  ?  ?  ?  ?  ?  ? ?Mysty Kielty ? ? ? ? ?

## 2021-12-04 NOTE — Discharge Instructions (Signed)
Lactation outpatient support - home visit ° ° °Jessica Bowers, IBCLC (lactation consultant)  & Birth Doula ° °Phone (text or call): [phone] °Email: [email or web address] °[email or web address] ° ° °Linda Coppola °RN, MHA, IBCLC °at [facility]: Lactation Consultant ° °[email or web address] °Mail: [email or web address] °Tel: [phone] ° ° °Additional breastfeeding resources: ° °[employer] [facility] °https://ibconline.ca/information-sheets/ ° °[employer] [place] ° °www.lllofnc.org ° ° °Other Resources: ° °Chiropractic specialist  ° °Dr. Leanna Hastings °[email or web address] ° ° °Craniosacral therapy for baby ° °Erin Balkind  °[email or web address]://[email or web address]/ ° °

## 2021-12-07 LAB — TYPE AND SCREEN
ABO/RH(D): AB POS
Antibody Screen: POSITIVE
Donor AG Type: NEGATIVE
Donor AG Type: NEGATIVE
Unit division: 0
Unit division: 0

## 2021-12-07 LAB — BPAM RBC
Blood Product Expiration Date: 202304102359
Blood Product Expiration Date: 202304102359
Unit Type and Rh: 5100
Unit Type and Rh: 5100

## 2021-12-14 ENCOUNTER — Telehealth (HOSPITAL_COMMUNITY): Payer: Self-pay | Admitting: *Deleted

## 2021-12-14 NOTE — Telephone Encounter (Signed)
Patient requested information on pelvic floor physical therapy. RN provided patient with contact information for [facility]. Patient voiced no other questions or concerns regarding her health at this time. EPDS=2. Patient voiced no questions or concerns regarding infant at this time. Patient reports infant sleeps in a Pack-N-Play on his back. RN reviewed ABCs of safe sleep. Patient verbalized understanding. Patient informed about hospital's virtual postpartum classes and support groups - declined email information at this time. Deforest Hoyles, RN, 12/14/21, 1012  ?

## 2023-11-18 ENCOUNTER — Ambulatory Visit

## 2023-11-18 ENCOUNTER — Encounter: Payer: Self-pay | Admitting: Emergency Medicine

## 2023-11-18 ENCOUNTER — Ambulatory Visit: Admission: EM | Admit: 2023-11-18 | Discharge: 2023-11-18 | Disposition: A | Discharge status: Home / Self Care

## 2023-11-18 DIAGNOSIS — M79662 Pain in left lower leg: Secondary | ICD-10-CM | POA: Diagnosis not present

## 2023-11-18 NOTE — ED Triage Notes (Signed)
 Patient had surgery x 8 days ago, not walking around much, c/o left calf pain x 2 days.  Patient messaged surgeon advised that she needs an ultrasound to r/o DVT.  Patient hasn't taken anything for pain today.

## 2023-11-18 NOTE — ED Provider Notes (Signed)
 [facility]    CSN: [id number] Arrival date & time: 11/18/23  1240      History   Chief Complaint Chief Complaint  Patient presents with   Leg Pain    HPI Brandi Day is a 38 y.o. female.  Left calf discomfort x 2 days. Not severe, but tender to touch. Had surgery 8 days ago, has not been walking around much She messaged her surgeon who suggested ultrasound Denies swelling or redness No fevers Not shortness of breath, no chest pain No medicines taken yet  No history of DVT. Remote history of OCPs  Past Medical History:  Diagnosis Date   Anemia    Headache    Postpartum care following vaginal delivery (1/30) 10/15/2015   Urinary incontinence     Patient Active Problem List   Diagnosis Date Noted   Encounter for induction of labor 12/03/2021   Postpartum care following vaginal delivery VBAC/VAVD 3/21 06/09/2017   VBAC, delivered 06/09/2017    Past Surgical History:  Procedure Laterality Date   CESAREAN SECTION  2013   WISDOM TOOTH EXTRACTION      OB History     Gravida  5   Para  4   Term  4   Preterm      AB  1   Living  4      SAB  1   IAB      Ectopic      Multiple  0   Live Births  4            Home Medications    Prior to Admission medications   Medication Sig Start Date End Date Taking? Authorizing Provider  cephALEXin (KEFLEX) 500 MG capsule Take 500 mg by mouth 2 (two) times daily. 11/16/23  Yes [provider]  diazepam (VALIUM) 5 MG tablet Take 5 mg by mouth 2 (two) times daily as needed. 11/17/23  Yes [provider]  HYDROcodone-acetaminophen (NORCO/VICODIN) 5-325 MG tablet Take 1 tablet by mouth every 6 (six) hours as needed. 11/16/23  Yes [provider]  montelukast (SINGULAIR) 10 MG tablet Take 10 mg by mouth daily. 10/19/23  Yes [provider]  benzocaine-Menthol (DERMOPLAST) 20-0.5 % AERO Apply 1 application. topically as needed for irritation (perineal discomfort).  12/04/21   Neta Mends, CNM  cetirizine (ZYRTEC) 10 MG tablet Take 10 mg by mouth daily.    [provider]  coconut oil OIL Apply 1 application. topically as needed. 12/04/21   Neta Mends, CNM  diphenhydrAMINE (BENADRYL) 25 MG tablet Take 25 mg by mouth 2 (two) times daily as needed (hives).    [provider]  ferrous sulfate 325 (65 FE) MG tablet Take 1 tablet (325 mg total) by mouth 2 (two) times daily with a meal. Patient taking differently: Take 325 mg by mouth daily. 06/10/17   Sigmon, Scarlette Slice, CNM  ibuprofen (ADVIL) 600 MG tablet Take 1 tablet (600 mg total) by mouth every 6 (six) hours. 12/04/21   Neta Mends, CNM  prenatal vitamin w/FE, FA (PRENATAL 1 + 1) 27-1 MG TABS tablet Take 1 tablet by mouth daily at 12 noon.    [provider]    Family History Family History  Problem Relation Age of Onset   Cancer Mother    Hypertension Father    Diabetes Father    Other Father        pacemaker   Heart attack Brother  Social History Social History   Tobacco Use   Smoking status: Former    Types: Cigarettes    Start date: 03/29/2011   Smokeless tobacco: Never  Vaping Use   Vaping status: Never Used  Substance Use Topics   Alcohol use: Yes    Comment: ocassionally   Drug use: No     Allergies   Gluten meal   Review of Systems Review of Systems Per HPI  Physical Exam Triage Vital Signs ED Triage Vitals  Encounter Vitals Group     BP 11/18/23 1253 118/78     Systolic BP Percentile --      Diastolic BP Percentile --      Pulse Rate 11/18/23 1253 86     Resp 11/18/23 1253 18     Temp 11/18/23 1253 98 F (36.7 C)     Temp Source 11/18/23 1253 Oral     SpO2 11/18/23 1253 98 %     Weight 11/18/23 1254 148 lb (67.1 kg)     Height 11/18/23 1254 5\' 3"  (1.6 m)     Head Circumference --      Peak Flow --      Pain Score 11/18/23 1254 4     Pain Loc --      Pain Education --      Exclude from Growth Chart --    No data  found.  Updated Vital Signs BP 118/78 (BP Location: Right Arm)   Pulse 86   Temp 98 F (36.7 C) (Oral)   Resp 18   Ht 5\' 3"  (1.6 m)   Wt 148 lb (67.1 kg)   LMP 11/06/2023   SpO2 98%   Breastfeeding No   BMI 26.22 kg/m   Visual Acuity Right Eye Distance:   Left Eye Distance:   Bilateral Distance:    Right Eye Near:   Left Eye Near:    Bilateral Near:     Physical Exam Vitals and nursing note reviewed.  Constitutional:      General: She is not in acute distress. HENT:     Mouth/Throat:     Mouth: Mucous membranes are moist.     Pharynx: Oropharynx is clear.  Cardiovascular:     Rate and Rhythm: Normal rate and regular rhythm.     Pulses: Normal pulses.     Heart sounds: Normal heart sounds.  Pulmonary:     Effort: Pulmonary effort is normal.     Breath sounds: Normal breath sounds.  Musculoskeletal:     Cervical back: Normal range of motion.       Legs:     Comments: Some tenderness of left posterior calf. No swelling or cords palpated. No erythema or warmth. Full ROM knee and ankle. Distal sensation intact. Strong DP pulse. Cap refill < 2 seconds  Skin:    Capillary Refill: Capillary refill takes less than 2 seconds.  Neurological:     Mental Status: She is alert and oriented to person, place, and time.     Gait: Gait normal.     UC Treatments / Results  Labs (all labs ordered are listed, but only abnormal results are displayed) Labs Reviewed - No data to display  EKG   Radiology US Venous Img Lower Unilateral Left Result Date: 11/18/2023 CLINICAL DATA:  calf pain, swelling, surgery last week EXAM: RIGHT LOWER EXTREMITY VENOUS DOPPLER ULTRASOUND TECHNIQUE: Gray-scale sonography with compression, as well as color and duplex ultrasound, were performed to evaluate the deep venous system(s) from  the level of the common femoral vein through the popliteal and proximal calf veins. COMPARISON:  RIGHT hip XRs, 06/11/2011 FINDINGS: VENOUS Normal compressibility of  the common femoral, superficial femoral, and popliteal veins, as well as the visualized calf veins. Visualized portions of profunda femoral vein and great saphenous vein unremarkable. No filling defects to suggest DVT on grayscale or color Doppler imaging. Doppler waveforms show normal direction of venous flow, normal respiratory plasticity and response to augmentation. Limited views of the contralateral common femoral vein are unremarkable. OTHER No evidence of superficial thrombophlebitis or abnormal fluid collection. Limitations: none IMPRESSION: No evidence of femoropopliteal DVT or superficial thrombophlebitis within the RIGHT lower extremity. Roanna Banning, MD [facility] Electronically Signed   By: Roanna Banning M.D.   On: 11/18/2023 14:12    Procedures Procedures (including critical care time)  Medications Ordered in UC Medications - No data to display  Initial Impression / Assessment and Plan / UC Course  I have reviewed the triage vital signs and the nursing notes.  Pertinent labs & imaging results that were available during my care of the patient were reviewed by me and considered in my medical decision making (see chart for details).  Left lower extremity ultrasound is negative for DVT The order is under LEFT lower extremity, and the LEFT leg was imaged, however the radiology report does read as right lower extremity. Please disregard, as the LEFT leg is the one with symptoms, imaged, and negative for DVT. Discussed with patient other possible etiologies Recommend symptomatic and supportive care such as elevate, ice, ibuprofen, gentle massage. Follow up with PCP if persisting. Return precautions discussed. Patient agrees to plan  Final Clinical Impressions(s) / UC Diagnoses   Final diagnoses:  Pain of left calf   Discharge Instructions   None    ED Prescriptions   None    PDMP not reviewed this encounter.   Shantina Chronister,  Lurena Joiner, PA-C 11/18/23 1440
